# Patient Record
Sex: Male | Born: 1970 | Race: White | Hispanic: No | Marital: Single | Smoking: Former smoker
Health system: Southern US, Community
[De-identification: ages and names within clinical notes are randomized; demographics above are authoritative.]

## PROBLEM LIST (undated history)

## (undated) DIAGNOSIS — J069 Acute upper respiratory infection, unspecified: Secondary | ICD-10-CM

## (undated) DIAGNOSIS — L089 Local infection of the skin and subcutaneous tissue, unspecified: Secondary | ICD-10-CM

## (undated) DIAGNOSIS — R05 Cough: Secondary | ICD-10-CM

## (undated) DIAGNOSIS — M25512 Pain in left shoulder: Secondary | ICD-10-CM

## (undated) DIAGNOSIS — M25561 Pain in right knee: Secondary | ICD-10-CM

## (undated) DIAGNOSIS — E785 Hyperlipidemia, unspecified: Secondary | ICD-10-CM

## (undated) DIAGNOSIS — N492 Inflammatory disorders of scrotum: Secondary | ICD-10-CM

## (undated) DIAGNOSIS — I252 Old myocardial infarction: Secondary | ICD-10-CM

## (undated) DIAGNOSIS — F329 Major depressive disorder, single episode, unspecified: Secondary | ICD-10-CM

## (undated) DIAGNOSIS — R079 Chest pain, unspecified: Secondary | ICD-10-CM

## (undated) DIAGNOSIS — R519 Headache, unspecified: Secondary | ICD-10-CM

## (undated) DIAGNOSIS — E669 Obesity, unspecified: Secondary | ICD-10-CM

## (undated) DIAGNOSIS — I10 Essential (primary) hypertension: Secondary | ICD-10-CM

## (undated) DIAGNOSIS — L0292 Furuncle, unspecified: Secondary | ICD-10-CM

## (undated) DIAGNOSIS — L400 Psoriasis vulgaris: Secondary | ICD-10-CM

## (undated) DIAGNOSIS — F419 Anxiety disorder, unspecified: Secondary | ICD-10-CM

## (undated) DIAGNOSIS — L02229 Furuncle of trunk, unspecified: Secondary | ICD-10-CM

## (undated) DIAGNOSIS — L408 Other psoriasis: Secondary | ICD-10-CM

## (undated) DIAGNOSIS — R7303 Prediabetes: Secondary | ICD-10-CM

## (undated) DIAGNOSIS — M25461 Effusion, right knee: Secondary | ICD-10-CM

## (undated) DIAGNOSIS — A4902 Methicillin resistant Staphylococcus aureus infection, unspecified site: Secondary | ICD-10-CM

## (undated) DIAGNOSIS — F410 Panic disorder [episodic paroxysmal anxiety] without agoraphobia: Secondary | ICD-10-CM

## (undated) DIAGNOSIS — E119 Type 2 diabetes mellitus without complications: Secondary | ICD-10-CM

## (undated) DIAGNOSIS — R5382 Chronic fatigue, unspecified: Secondary | ICD-10-CM

## (undated) DIAGNOSIS — H538 Other visual disturbances: Secondary | ICD-10-CM

## (undated) DIAGNOSIS — A084 Viral intestinal infection, unspecified: Secondary | ICD-10-CM

## (undated) DIAGNOSIS — K219 Gastro-esophageal reflux disease without esophagitis: Secondary | ICD-10-CM

## (undated) DIAGNOSIS — M79603 Pain in arm, unspecified: Secondary | ICD-10-CM

## (undated) DIAGNOSIS — I712 Thoracic aortic aneurysm, without rupture: Secondary | ICD-10-CM

## (undated) DIAGNOSIS — R059 Cough, unspecified: Secondary | ICD-10-CM

## (undated) DIAGNOSIS — R609 Edema, unspecified: Secondary | ICD-10-CM

## (undated) DIAGNOSIS — L0293 Carbuncle, unspecified: Secondary | ICD-10-CM

## (undated) DIAGNOSIS — R51 Headache: Secondary | ICD-10-CM

## (undated) HISTORY — DX: Acute upper respiratory infection, unspecified: J06.9

## (undated) HISTORY — DX: Carbuncle, unspecified: L02.93

## (undated) HISTORY — DX: Panic disorder (episodic paroxysmal anxiety): F41.0

## (undated) HISTORY — DX: Anxiety disorder, unspecified: F41.9

## (undated) HISTORY — DX: Gastro-esophageal reflux disease without esophagitis: K21.9

## (undated) HISTORY — DX: Pain in left shoulder: M25.512

## (undated) HISTORY — DX: Prediabetes: R73.03

## (undated) HISTORY — DX: Old myocardial infarction: I25.2

## (undated) HISTORY — DX: Local infection of the skin and subcutaneous tissue, unspecified: L08.9

## (undated) HISTORY — DX: Major depressive disorder, single episode, unspecified: F32.9

## (undated) HISTORY — DX: Headache: R51

## (undated) HISTORY — DX: Effusion, right knee: M25.461

## (undated) HISTORY — DX: Inflammatory disorders of scrotum: N49.2

## (undated) HISTORY — DX: Cough: R05

## (undated) HISTORY — DX: Furuncle, unspecified: L02.92

## (undated) HISTORY — DX: Essential (primary) hypertension: I10

## (undated) HISTORY — DX: Other psoriasis: L40.8

## (undated) HISTORY — DX: Cough, unspecified: R05.9

## (undated) HISTORY — DX: Viral intestinal infection, unspecified: A08.4

## (undated) HISTORY — DX: Pain in arm, unspecified: M79.603

## (undated) HISTORY — DX: Furuncle of trunk, unspecified: L02.229

## (undated) HISTORY — DX: Edema, unspecified: R60.9

## (undated) HISTORY — DX: Chronic fatigue, unspecified: R53.82

## (undated) HISTORY — DX: Obesity, unspecified: E66.9

## (undated) HISTORY — DX: Psoriasis vulgaris: L40.0

## (undated) HISTORY — PX: FINGER SURGERY: SHX640

## (undated) HISTORY — DX: Headache, unspecified: R51.9

## (undated) HISTORY — DX: Other visual disturbances: H53.8

## (undated) HISTORY — DX: Methicillin resistant Staphylococcus aureus infection, unspecified site: A49.02

## (undated) HISTORY — DX: Chest pain, unspecified: R07.9

## (undated) HISTORY — DX: Pain in right knee: M25.561

## (undated) HISTORY — DX: Thoracic aortic aneurysm, without rupture: I71.2

## (undated) HISTORY — DX: Hyperlipidemia, unspecified: E78.5

---

## 2003-08-16 DIAGNOSIS — I252 Old myocardial infarction: Secondary | ICD-10-CM

## 2003-08-16 HISTORY — DX: Old myocardial infarction: I25.2

## 2004-07-14 ENCOUNTER — Ambulatory Visit: Payer: Self-pay | Admitting: Cardiology

## 2007-08-16 ENCOUNTER — Emergency Department (HOSPITAL_COMMUNITY): Admission: EM | Admit: 2007-08-16 | Discharge: 2007-08-16 | Payer: Self-pay | Admitting: Emergency Medicine

## 2010-10-02 ENCOUNTER — Emergency Department (HOSPITAL_COMMUNITY)
Admission: EM | Admit: 2010-10-02 | Discharge: 2010-10-02 | Disposition: A | Discharge status: Home / Self Care | Payer: Self-pay | Attending: Nurse Practitioner | Admitting: Nurse Practitioner

## 2010-10-02 ENCOUNTER — Emergency Department (HOSPITAL_COMMUNITY): Payer: Self-pay

## 2010-10-02 DIAGNOSIS — E669 Obesity, unspecified: Secondary | ICD-10-CM | POA: Insufficient documentation

## 2010-10-02 DIAGNOSIS — R05 Cough: Secondary | ICD-10-CM | POA: Insufficient documentation

## 2010-10-02 DIAGNOSIS — M545 Low back pain, unspecified: Secondary | ICD-10-CM | POA: Insufficient documentation

## 2010-10-02 DIAGNOSIS — J069 Acute upper respiratory infection, unspecified: Secondary | ICD-10-CM | POA: Insufficient documentation

## 2010-10-02 DIAGNOSIS — R059 Cough, unspecified: Secondary | ICD-10-CM | POA: Insufficient documentation

## 2010-10-02 DIAGNOSIS — J40 Bronchitis, not specified as acute or chronic: Secondary | ICD-10-CM | POA: Insufficient documentation

## 2010-10-02 DIAGNOSIS — J3489 Other specified disorders of nose and nasal sinuses: Secondary | ICD-10-CM | POA: Insufficient documentation

## 2011-05-05 LAB — RAPID STREP SCREEN (MED CTR MEBANE ONLY): Streptococcus, Group A Screen (Direct): NEGATIVE

## 2011-05-05 LAB — STREP A DNA PROBE

## 2012-06-14 ENCOUNTER — Emergency Department (HOSPITAL_COMMUNITY): Payer: Self-pay

## 2012-06-14 ENCOUNTER — Emergency Department (HOSPITAL_COMMUNITY)
Admission: EM | Admit: 2012-06-14 | Discharge: 2012-06-14 | Disposition: A | Discharge status: Home / Self Care | Payer: Self-pay | Attending: Emergency Medicine | Admitting: Emergency Medicine

## 2012-06-14 DIAGNOSIS — J029 Acute pharyngitis, unspecified: Secondary | ICD-10-CM | POA: Insufficient documentation

## 2012-06-14 DIAGNOSIS — R42 Dizziness and giddiness: Secondary | ICD-10-CM | POA: Insufficient documentation

## 2012-06-14 DIAGNOSIS — R197 Diarrhea, unspecified: Secondary | ICD-10-CM | POA: Insufficient documentation

## 2012-06-14 DIAGNOSIS — R51 Headache: Secondary | ICD-10-CM | POA: Insufficient documentation

## 2012-06-14 DIAGNOSIS — R110 Nausea: Secondary | ICD-10-CM | POA: Insufficient documentation

## 2012-06-14 LAB — CBC WITH DIFFERENTIAL/PLATELET
HCT: 46.8 % (ref 39.0–52.0)
Hemoglobin: 15.9 g/dL (ref 13.0–17.0)
Lymphocytes Relative: 42 % (ref 12–46)
Lymphs Abs: 3.6 10*3/uL (ref 0.7–4.0)
Monocytes Absolute: 0.6 10*3/uL (ref 0.1–1.0)
Monocytes Relative: 7 % (ref 3–12)
Neutro Abs: 4.1 10*3/uL (ref 1.7–7.7)
RBC: 5.43 MIL/uL (ref 4.22–5.81)
WBC: 8.5 10*3/uL (ref 4.0–10.5)

## 2012-06-14 LAB — RAPID STREP SCREEN (MED CTR MEBANE ONLY): Streptococcus, Group A Screen (Direct): NEGATIVE

## 2012-06-14 LAB — URINALYSIS, ROUTINE W REFLEX MICROSCOPIC
Hgb urine dipstick: NEGATIVE
Ketones, ur: NEGATIVE mg/dL
Leukocytes, UA: NEGATIVE
Protein, ur: NEGATIVE mg/dL
Urobilinogen, UA: 0.2 mg/dL (ref 0.0–1.0)

## 2012-06-14 LAB — COMPREHENSIVE METABOLIC PANEL
Alkaline Phosphatase: 62 U/L (ref 39–117)
BUN: 8 mg/dL (ref 6–23)
CO2: 27 mEq/L (ref 19–32)
Chloride: 101 mEq/L (ref 96–112)
Creatinine, Ser: 0.86 mg/dL (ref 0.50–1.35)
GFR calc non Af Amer: >90 mL/min (ref >90)
Potassium: 3.6 mEq/L (ref 3.5–5.1)
Total Bilirubin: 0.3 mg/dL (ref 0.3–1.2)

## 2012-06-14 MED ORDER — MECLIZINE HCL 25 MG PO TABS: 25.0000 mg | ORAL_TABLET | Freq: Four times a day (QID) | ORAL | Status: DC

## 2012-06-14 MED ORDER — MECLIZINE HCL 12.5 MG PO TABS
25.0000 mg | ORAL_TABLET | Freq: Once | ORAL | Status: AC
  Administered 2012-06-14: 25 mg via ORAL
  Filled 2012-06-14: qty 2

## 2012-06-14 NOTE — ED Provider Notes (Signed)
History     CSN: [id number]  Arrival date & time 06/14/12  1604   First MD Initiated Contact with Patient 06/14/12 1648      Chief Complaint  Patient presents with  . Dizziness  . Headache    (Consider location/radiation/quality/duration/timing/severity/associated sxs/prior treatment) HPI Comments: Patient is a 41 year old man who says he has had a pressure feeling behind his eyes, has felt chills, and he is dizzy when he changes position. He's also had some nausea and some diarrhea. He thinks he may of had fever last night. He drank tiny and whiskey last night time to get relief. He took no other type of medication. For his symptoms have persisted for approximately 36 hours he sought evaluation.  Patient is a 41 y.o. male presenting with headaches. The history is provided by the patient. No language interpreter was used.  Headache  This is a new problem. The current episode started 2 days ago. The problem occurs constantly. The problem has not changed since onset.Associated with: Dizziness, nausea. The pain is located in the frontal region. The pain is moderate. The pain does not radiate. Associated symptoms include a fever (subjective feeling of fever) and nausea. Pertinent negatives include no vomiting. Associated symptoms comments: Dizziness. He has tried nothing for the symptoms.    No past medical history on file.  No past surgical history on file.  No family history on file.  History  Substance Use Topics  . Smoking status: Not on file  . Smokeless tobacco: Not on file  . Alcohol Use: Not on file      Review of Systems  Constitutional: Positive for fever (subjective feeling of fever) and chills.       Subjective feeling of fever.  HENT: Positive for sore throat.   Eyes: Negative.   Respiratory: Negative.   Cardiovascular: Negative.   Gastrointestinal: Positive for nausea and diarrhea. Negative for vomiting.  Genitourinary: Negative.   Musculoskeletal: Negative.    Skin: Negative.   Neurological: Positive for dizziness and headaches.  Psychiatric/Behavioral: Negative.     Allergies  Penicillins and Clindamycin/lincomycin  Home Medications  No current outpatient prescriptions on file.  BP 134/81  Pulse 77  Temp 98.2 F (36.8 C) (Oral)  Resp 16  Ht 5\' 10"  (1.778 m)  Wt 230 lb (104.327 kg)  BMI 33.00 kg/m2  SpO2 100%  Physical Exam  Nursing note and vitals reviewed. Constitutional: He is oriented to person, place, and time. He appears well-developed and well-nourished. No distress.       In mild distress, complaining of headache and dizziness.  HENT:  Head: Normocephalic and atraumatic.  Right Ear: External ear normal.  Left Ear: External ear normal.  Nose: Nose normal.  Mouth/Throat: Oropharynx is clear and moist.       Mild pain over the right supraorbital sinus.  Eyes: Conjunctivae normal are normal. Pupils are equal, round, and reactive to light.       He has nystagmus on left lateral gaze.  Neck: Normal range of motion. Neck supple.  Cardiovascular: Normal rate, regular rhythm and normal heart sounds.   Pulmonary/Chest: Effort normal and breath sounds normal.  Abdominal: Soft. Bowel sounds are normal.  Musculoskeletal: Normal range of motion. He exhibits no edema and no tenderness.  Neurological: He is alert and oriented to person, place, and time.       No sensory or motor deficit. Change of position seems to cause him dizziness.  Skin: Skin is warm and dry.  Psychiatric:  He has a normal mood and affect. His behavior is normal.    ED Course  Procedures (including critical care time)  5:13 PM Pt seen --> physical exam performed. Lab workup ordered. PO Antivert ordered.  7:06 PM Results for orders placed during the hospital encounter of 06/14/12  CBC WITH DIFFERENTIAL      Component Value Range   WBC 8.5  4.0 - 10.5 K/uL   RBC 5.43  4.22 - 5.81 MIL/uL   Hemoglobin 15.9  13.0 - 17.0 g/dL   HCT 40.9  81.1 - 91.4 %    MCV 86.2  78.0 - 100.0 fL   MCH 29.3  26.0 - 34.0 pg   MCHC 34.0  30.0 - 36.0 g/dL   RDW 78.2  95.6 - 21.3 %   Platelets 185  150 - 400 K/uL   Neutrophils Relative 48  43 - 77 %   Neutro Abs 4.1  1.7 - 7.7 K/uL   Lymphocytes Relative 42  12 - 46 %   Lymphs Abs 3.6  0.7 - 4.0 K/uL   Monocytes Relative 7  3 - 12 %   Monocytes Absolute 0.6  0.1 - 1.0 K/uL   Eosinophils Relative 3  0 - 5 %   Eosinophils Absolute 0.3  0.0 - 0.7 K/uL   Basophils Relative 1  0 - 1 %   Basophils Absolute 0.1  0.0 - 0.1 K/uL  COMPREHENSIVE METABOLIC PANEL      Component Value Range   Sodium 136  135 - 145 mEq/L   Potassium 3.6  3.5 - 5.1 mEq/L   Chloride 101  96 - 112 mEq/L   CO2 27  19 - 32 mEq/L   Glucose, Bld 105 (*) 70 - 99 mg/dL   BUN 8  6 - 23 mg/dL   Creatinine, Ser 0.86  0.50 - 1.35 mg/dL   Calcium 9.4  8.4 - 57.8 mg/dL   Total Protein 7.2  6.0 - 8.3 g/dL   Albumin 3.7  3.5 - 5.2 g/dL   AST 16  0 - 37 U/L   ALT 21  0 - 53 U/L   Alkaline Phosphatase 62  39 - 117 U/L   Total Bilirubin 0.3  0.3 - 1.2 mg/dL   GFR calc non Af Amer >90  >90 mL/min   GFR calc Af Amer >90  >90 mL/min  URINALYSIS, ROUTINE W REFLEX MICROSCOPIC      Component Value Range   Color, Urine YELLOW  YELLOW   APPearance CLEAR  CLEAR   Specific Gravity, Urine 1.020  1.005 - 1.030   pH 6.5  5.0 - 8.0   Glucose, UA NEGATIVE  NEGATIVE mg/dL   Hgb urine dipstick NEGATIVE  NEGATIVE   Bilirubin Urine NEGATIVE  NEGATIVE   Ketones, ur NEGATIVE  NEGATIVE mg/dL   Protein, ur NEGATIVE  NEGATIVE mg/dL   Urobilinogen, UA 0.2  0.0 - 1.0 mg/dL   Nitrite NEGATIVE  NEGATIVE   Leukocytes, UA NEGATIVE  NEGATIVE  RAPID STREP SCREEN      Component Value Range   Streptococcus, Group A Screen (Direct) NEGATIVE  NEGATIVE   Ct Head Wo Contrast  06/14/2012  *RADIOLOGY REPORT*  Clinical Data:  History of frontal headache, nausea, and dizziness. Question low grade fever.  CT HEAD WITHOUT CONTRAST  Technique: Contiguous axial images were  obtained from the base of the skull through the vertex without contrast  Comparison:  None  Findings:  There is no evidence of brain  mass, brain hemorrhage, or acute infarction.  The ventricular system is normal size and shape.  There is no evidence of shift of midline structures, parenchymal lesion, or subdural or epidural hematoma.  The calvarium is intact.  Mastoids are well aerated.  No sinusitis is evident. There is slight nasal septal deviation to the right.  IMPRESSION: There is no evidence of brain mass, brain hemorrhage, or acute infarction.  No acute or active process is seen.  No skull lesion is evident. No sinusitis is evident.   Original Report Authenticated By: Onalee Hua Call     Lab tests all normal.  Pt feels better after taking Antivert.  Rx Antivert 25 mg qid.   1. Vertigo       Carleene Cooper III, MD 06/14/12 [phone]

## 2012-06-14 NOTE — ED Notes (Addendum)
Pt states dizziness, headache, nausea, chills. Symptoms began yesterday.diarrhea also

## 2012-06-14 NOTE — ED Notes (Signed)
Patient with no complaints at this time. Respirations even and unlabored. Skin warm/dry. Discharge instructions reviewed with patient at this time. Patient given opportunity to voice concerns/ask questions. Patient discharged at this time and left Emergency Department with steady gait.   

## 2013-06-24 ENCOUNTER — Emergency Department (HOSPITAL_COMMUNITY)
Admission: EM | Admit: 2013-06-24 | Discharge: 2013-06-25 | Disposition: A | Discharge status: Home / Self Care | Payer: PRIVATE HEALTH INSURANCE | Attending: Emergency Medicine | Admitting: Emergency Medicine

## 2013-06-24 ENCOUNTER — Emergency Department (HOSPITAL_COMMUNITY): Payer: PRIVATE HEALTH INSURANCE

## 2013-06-24 ENCOUNTER — Encounter (HOSPITAL_COMMUNITY): Payer: Self-pay | Admitting: Emergency Medicine

## 2013-06-24 DIAGNOSIS — M549 Dorsalgia, unspecified: Secondary | ICD-10-CM | POA: Insufficient documentation

## 2013-06-24 DIAGNOSIS — Z881 Allergy status to other antibiotic agents status: Secondary | ICD-10-CM | POA: Insufficient documentation

## 2013-06-24 DIAGNOSIS — R079 Chest pain, unspecified: Secondary | ICD-10-CM | POA: Insufficient documentation

## 2013-06-24 DIAGNOSIS — Z880 Allergy status to penicillin: Secondary | ICD-10-CM | POA: Insufficient documentation

## 2013-06-24 DIAGNOSIS — R0602 Shortness of breath: Secondary | ICD-10-CM | POA: Insufficient documentation

## 2013-06-24 DIAGNOSIS — IMO0001 Reserved for inherently not codable concepts without codable children: Secondary | ICD-10-CM | POA: Insufficient documentation

## 2013-06-24 DIAGNOSIS — R61 Generalized hyperhidrosis: Secondary | ICD-10-CM | POA: Insufficient documentation

## 2013-06-24 DIAGNOSIS — F17200 Nicotine dependence, unspecified, uncomplicated: Secondary | ICD-10-CM | POA: Insufficient documentation

## 2013-06-24 DIAGNOSIS — R110 Nausea: Secondary | ICD-10-CM | POA: Insufficient documentation

## 2013-06-24 LAB — CBC
HCT: 43.2 % (ref 39.0–52.0)
MCH: 29.8 pg (ref 26.0–34.0)
MCHC: 34.3 g/dL (ref 30.0–36.0)
RDW: 12.6 % (ref 11.5–15.5)

## 2013-06-24 LAB — BASIC METABOLIC PANEL
BUN: 9 mg/dL (ref 6–23)
Calcium: 9.2 mg/dL (ref 8.4–10.5)
GFR calc Af Amer: >90 mL/min (ref >90)
GFR calc non Af Amer: >90 mL/min (ref >90)
Glucose, Bld: 90 mg/dL (ref 70–99)
Potassium: 3.8 mEq/L (ref 3.5–5.1)

## 2013-06-24 LAB — POCT I-STAT TROPONIN I

## 2013-06-24 NOTE — ED Notes (Signed)
Pt. reports intermittent mid chest pain onset today with SOB and nausea.

## 2013-06-25 MED ORDER — ASPIRIN 81 MG PO CHEW
324.0000 mg | CHEWABLE_TABLET | Freq: Once | ORAL | Status: AC
  Administered 2013-06-25: 324 mg via ORAL
  Filled 2013-06-25: qty 4

## 2013-06-25 NOTE — ED Provider Notes (Signed)
CSN: [id number]     Arrival date & time 06/24/13  2245 History   First MD Initiated Contact with Patient 06/25/13 0003     Chief Complaint  Patient presents with  . Chest Pain   (Consider location/radiation/quality/duration/timing/severity/associated sxs/prior Treatment) HPI Patient's 42 year old male with a 30+ year pack history of smoking who presents with acute onset left-sided chest pain starting around 10 PM this evening. Pain did not radiate. Associate with shortness of breath and nausea. Patient was adopted and doesn't know family history. No recent travel or immobilization. He states he has chronic muscular pain including chest pain though this episode was different than chronic pain. States he was in the [facility] emergency department 8 years ago and told that he had a "minor heart attack". Patient has never seen a cardiologist. He states the pain is relieved currently. History reviewed. No pertinent past medical history. History reviewed. No pertinent past surgical history. No family history on file. History  Substance Use Topics  . Smoking status: Never Smoker   . Smokeless tobacco: Not on file  . Alcohol Use: No    Review of Systems  Constitutional: Positive for diaphoresis. Negative for fever and chills.  Respiratory: Positive for shortness of breath. Negative for cough and wheezing.   Cardiovascular: Positive for chest pain. Negative for palpitations and leg swelling.  Gastrointestinal: Positive for nausea. Negative for vomiting, abdominal pain, diarrhea and constipation.  Musculoskeletal: Positive for back pain and myalgias.  Skin: Negative for rash and wound.  Neurological: Negative for dizziness, weakness, light-headedness, numbness and headaches.  All other systems reviewed and are negative.    Allergies  Penicillins and Clindamycin/lincomycin  Home Medications  No current outpatient prescriptions on file. BP 129/85  Pulse 82  Temp(Src) 98.1 F (36.7 C)  (Oral)  Resp 14  SpO2 99% Physical Exam  Nursing note and vitals reviewed. Constitutional: He is oriented to person, place, and time. He appears well-developed and well-nourished. No distress.  HENT:  Head: Normocephalic and atraumatic.  Mouth/Throat: Oropharynx is clear and moist.  Eyes: EOM are normal. Pupils are equal, round, and reactive to light.  Neck: Normal range of motion. Neck supple.  Cardiovascular: Normal rate and regular rhythm.   Pulmonary/Chest: Effort normal and breath sounds normal. No respiratory distress. He has no wheezes. He has no rales. He exhibits tenderness (mild left lower chest tenderness to palpation. No crepitance or deformity.).  Abdominal: Soft. Bowel sounds are normal. He exhibits no distension and no mass. There is no tenderness. There is no rebound and no guarding.  Musculoskeletal: Normal range of motion. He exhibits no edema and no tenderness.  No calf swelling or tenderness.  Neurological: He is alert and oriented to person, place, and time.  Patient is alert and oriented x3 with clear, goal oriented speech. Patient has 5/5 motor in all extremities. Sensation is intact to light touch.    Skin: Skin is warm and dry. No rash noted. No erythema.  Psychiatric: He has a normal mood and affect. His behavior is normal.    ED Course  Procedures (including critical care time) Labs Review Labs Reviewed  CBC  BASIC METABOLIC PANEL  PRO B NATRIURETIC PEPTIDE  POCT I-STAT TROPONIN I   Imaging Review Dg Chest 2 View  06/25/2013   CLINICAL DATA:  Chest discomfort and shortness of breath.  EXAM: CHEST  2 VIEW  COMPARISON:  Chest radiograph performed 10/02/2010  FINDINGS: The lungs are well-aerated and clear. There is no evidence of focal opacification,  pleural effusion or pneumothorax.  The heart is normal in size; the mediastinal contour is within normal limits. No acute osseous abnormalities are seen.  IMPRESSION: No acute cardiopulmonary process seen.    Electronically Signed   By: Roanna Raider M.D.   On: 06/25/2013 00:09    EKG Interpretation     Ventricular Rate:  81 PR Interval:  124 QRS Duration: 94 QT Interval:  378 QTC Calculation: 439 R Axis:   49 Text Interpretation:  Normal sinus rhythm with sinus arrhythmia Normal ECG            MDM  We'll try to get records from [place]. Will discuss with cardiologist for possible admission.   Reviewed records from [facility]. Patient seen in emergency department on 06/28/04 for sharp chest pain, shortness of breath, and dizziness. Patient had a normal EKG and normal cardiac enzymes at the time. He also has a CT angiogram of the chest which showed no evidence of PE or aortic dissection. He declined admission but there is no evidence of any myocardial infarction or coronary artery disease.  Discussed patient with cardiology on-call. Stay the symptoms sounded atypical. Advised belt a troponin and if normal the patient remains asymptomatic can follow up with cardiology as an outpatient for further risk stratification.  Discuss results with the patient and the need to followup with cardiologist. He understands the need to return immediately for worsening symptoms or for any concerns. He is in agreement with plan.  Loren Racer, MD 06/25/13 [phone]

## 2013-08-13 ENCOUNTER — Encounter (HOSPITAL_COMMUNITY): Payer: Self-pay | Admitting: Emergency Medicine

## 2013-08-13 ENCOUNTER — Emergency Department (HOSPITAL_COMMUNITY): Payer: PRIVATE HEALTH INSURANCE

## 2013-08-13 ENCOUNTER — Emergency Department (HOSPITAL_COMMUNITY)
Admission: EM | Admit: 2013-08-13 | Discharge: 2013-08-13 | Disposition: A | Discharge status: Home / Self Care | Payer: PRIVATE HEALTH INSURANCE | Attending: Emergency Medicine | Admitting: Emergency Medicine

## 2013-08-13 DIAGNOSIS — Z880 Allergy status to penicillin: Secondary | ICD-10-CM | POA: Insufficient documentation

## 2013-08-13 DIAGNOSIS — Z87891 Personal history of nicotine dependence: Secondary | ICD-10-CM | POA: Insufficient documentation

## 2013-08-13 DIAGNOSIS — R0602 Shortness of breath: Secondary | ICD-10-CM | POA: Insufficient documentation

## 2013-08-13 DIAGNOSIS — R209 Unspecified disturbances of skin sensation: Secondary | ICD-10-CM | POA: Insufficient documentation

## 2013-08-13 DIAGNOSIS — R42 Dizziness and giddiness: Secondary | ICD-10-CM | POA: Insufficient documentation

## 2013-08-13 DIAGNOSIS — R079 Chest pain, unspecified: Secondary | ICD-10-CM

## 2013-08-13 DIAGNOSIS — R072 Precordial pain: Secondary | ICD-10-CM | POA: Insufficient documentation

## 2013-08-13 LAB — CBC WITH DIFFERENTIAL/PLATELET
Basophils Absolute: 0.1 10*3/uL (ref 0.0–0.1)
Eosinophils Relative: 2 % (ref 0–5)
HCT: 43.7 % (ref 39.0–52.0)
Hemoglobin: 14.7 g/dL (ref 13.0–17.0)
Lymphocytes Relative: 40 % (ref 12–46)
Lymphs Abs: 3.2 10*3/uL (ref 0.7–4.0)
MCV: 86.5 fL (ref 78.0–100.0)
Monocytes Absolute: 0.7 10*3/uL (ref 0.1–1.0)
Neutro Abs: 4 10*3/uL (ref 1.7–7.7)
Platelets: 189 10*3/uL (ref 150–400)
RBC: 5.05 MIL/uL (ref 4.22–5.81)
WBC: 8.2 10*3/uL (ref 4.0–10.5)

## 2013-08-13 LAB — BASIC METABOLIC PANEL
CO2: 25 mEq/L (ref 19–32)
Chloride: 100 mEq/L (ref 96–112)
GFR calc Af Amer: >90 mL/min (ref >90)
Sodium: 137 mEq/L (ref 137–147)

## 2013-08-13 LAB — POCT I-STAT TROPONIN I
Troponin i, poc: 0 ng/mL (ref 0.00–0.08)
Troponin i, poc: 0 ng/mL (ref 0.00–0.08)

## 2013-08-13 NOTE — ED Provider Notes (Signed)
CSN: [id number]     Arrival date & time 08/13/13  0221 History   First MD Initiated Contact with Patient 08/13/13 [phone]     Chief Complaint  Patient presents with  . Chest Pain    Patient is a 42 y.o. male presenting with chest pain. The history is provided by the patient.  Chest Pain Pain location:  Substernal area Pain quality: pressure   Pain radiates to:  Does not radiate Pain radiates to the back: no   Pain severity:  Mild Onset quality:  Gradual Timing:  Intermittent Progression:  Unchanged Relieved by:  Nothing Worsened by:  Nothing tried Associated symptoms: dizziness and shortness of breath   Associated symptoms: no abdominal pain, no fever, no headache, no syncope, not vomiting and no weakness   pt reports for two days he has had chest pressure that does not radiate.  It will last for up to 45 minutes and resolve on its own.  It is not provoked by exertion and will come at random CP is not pleuritic No fever/cough He reports mild SOB He also reports tonight he felt mild dizziness without HA No focal weakness He reports "tingling" in his hands/feet. No LE pain/edema No h/o DVT/PE  He reports tonight he checked his BP and it was elevated and there was a difference between his arms and he wanted to be evaluated.   PMH - none History reviewed. No pertinent past surgical history. No family history on file. History  Substance Use Topics  . Smoking status: Former Games developer  . Smokeless tobacco: Not on file  . Alcohol Use: Yes    Review of Systems  Constitutional: Negative for fever.  Respiratory: Positive for shortness of breath.   Cardiovascular: Positive for chest pain. Negative for syncope.  Gastrointestinal: Negative for vomiting, abdominal pain and diarrhea.  Neurological: Positive for dizziness. Negative for syncope, weakness and headaches.  All other systems reviewed and are negative.    Allergies  Penicillins and Clindamycin/lincomycin  Home  Medications  No current outpatient prescriptions on file. BP 148/88  Pulse 85  Temp(Src) 98.3 F (36.8 C) (Oral)  Resp 20  Ht 5\' 10"  (1.778 m)  Wt 256 lb (116.121 kg)  BMI 36.73 kg/m2  SpO2 96% Physical Exam CONSTITUTIONAL: Well developed/well nourished, no distress, well appearing HEAD: Normocephalic/atraumatic EYES: EOMI/PERRL ENMT: Mucous membranes moist NECK: supple no meningeal signs SPINE:entire spine nontender CV: S1/S2 noted, no murmurs/rubs/gallops noted LUNGS: Lungs are clear to auscultation bilaterally, no apparent distress ABDOMEN: soft, nontender, no rebound or guarding GU:no cva tenderness NEURO: Pt is awake/alert, moves all extremitiesx4 EXTREMITIES: pulses normal, full ROM, no calf tenderness or edema noted SKIN: warm, color normal PSYCH: no abnormalities of mood noted  ED Course  Procedures   3:57 AM Pt well appearing He is using phone, no distress He reports he is essentially pain free and reports he only had pressure in chest and was not exertional No pleuritic pain reported He does report [place] and told he had minor MI but no stent placed.  He reports he never followed up with cardiologist My suspicion for ACS/PE/dissection is low at this time His BP's were essentially equal in each arm when done manually Will repeat troponin/ekg at 3hr mark and if unchanged will d/c home  5:36 AM Repeat EKG/troponin unchanged Pt well appearing, talkative, using phone Stable for d/c home Labs Review Labs Reviewed  BASIC METABOLIC PANEL  CBC WITH DIFFERENTIAL  Imaging Review No results found.  EKG Interpretation    Date/Time:  Tuesday August 13 2013 02:22:11 EST Ventricular Rate:  82 PR Interval:  118 QRS Duration: 94 QT Interval:  370 QTC Calculation: 432 R Axis:   49 Text Interpretation:  Normal sinus rhythm Normal ECG When compared with ECG of 24-Jun-2013 22:48, No significant change was found Confirmed by  Bebe Shaggy  MD, Ardella Chhim [phone]) on 08/13/2013 2:44:10 AM           EKG Interpretation    Date/Time:  Tuesday August 13 2013 04:56:05 EST Ventricular Rate:  80 PR Interval:  128 QRS Duration: 94 QT Interval:  378 QTC Calculation: 435 R Axis:   43 Text Interpretation:  Normal sinus rhythm Normal ECG When compared with ECG of 13-Aug-2013 02:22, No significant change was found Confirmed by Bebe Shaggy  MD, Lavelle Berland ([id number]) on 08/13/2013 5:19:04 AM             MDM  No diagnosis found. Nursing notes including past medical history and social history reviewed and considered in documentation xrays reviewed and considered Labs/vital reviewed and considered Previous records reviewed and considered - previous ED records reviewed     Joya Gaskins, MD 08/13/13 [phone]

## 2013-08-13 NOTE — ED Notes (Signed)
Patient c/o high blood pressure tonight with dizziness and states his chest feels "tense".

## 2015-02-08 ENCOUNTER — Emergency Department (HOSPITAL_COMMUNITY)
Admission: EM | Admit: 2015-02-08 | Discharge: 2015-02-08 | Disposition: A | Discharge status: Home / Self Care | Payer: PRIVATE HEALTH INSURANCE | Attending: Emergency Medicine | Admitting: Emergency Medicine

## 2015-02-08 ENCOUNTER — Encounter (HOSPITAL_COMMUNITY): Payer: Self-pay | Admitting: Emergency Medicine

## 2015-02-08 DIAGNOSIS — L03115 Cellulitis of right lower limb: Secondary | ICD-10-CM | POA: Insufficient documentation

## 2015-02-08 DIAGNOSIS — Z87891 Personal history of nicotine dependence: Secondary | ICD-10-CM | POA: Insufficient documentation

## 2015-02-08 DIAGNOSIS — Z880 Allergy status to penicillin: Secondary | ICD-10-CM | POA: Insufficient documentation

## 2015-02-08 DIAGNOSIS — L089 Local infection of the skin and subcutaneous tissue, unspecified: Secondary | ICD-10-CM | POA: Diagnosis present

## 2015-02-08 DIAGNOSIS — L02415 Cutaneous abscess of right lower limb: Secondary | ICD-10-CM | POA: Diagnosis not present

## 2015-02-08 DIAGNOSIS — L0291 Cutaneous abscess, unspecified: Secondary | ICD-10-CM

## 2015-02-08 DIAGNOSIS — J020 Streptococcal pharyngitis: Secondary | ICD-10-CM | POA: Insufficient documentation

## 2015-02-08 DIAGNOSIS — L0390 Cellulitis, unspecified: Secondary | ICD-10-CM

## 2015-02-08 LAB — RAPID STREP SCREEN (MED CTR MEBANE ONLY): Streptococcus, Group A Screen (Direct): POSITIVE — AB

## 2015-02-08 MED ORDER — IBUPROFEN 800 MG PO TABS: 800.0000 mg | ORAL_TABLET | Freq: Three times a day (TID) | ORAL | Status: DC | PRN

## 2015-02-08 MED ORDER — DOXYCYCLINE HYCLATE 100 MG PO TABS
100.0000 mg | ORAL_TABLET | Freq: Once | ORAL | Status: AC
  Administered 2015-02-08: 100 mg via ORAL
  Filled 2015-02-08: qty 1

## 2015-02-08 MED ORDER — DOXYCYCLINE HYCLATE 100 MG PO CAPS: 100.0000 mg | ORAL_CAPSULE | Freq: Two times a day (BID) | ORAL | Status: DC

## 2015-02-08 NOTE — ED Notes (Signed)
PT c/o open area with redness and serosanguinous fluid drainage to right knee x1 week. PT also c/o sore throat with known exposure to strep throat this week but denies any fevers since a few days ago.

## 2015-02-08 NOTE — Discharge Instructions (Signed)
Abscess °An abscess is an infected area that contains a collection of pus and debris. It can occur in almost any part of the body. An abscess is also known as a furuncle or boil. °CAUSES  °An abscess occurs when tissue gets infected. This can occur from blockage of oil or sweat glands, infection of hair follicles, or a minor injury to the skin. As the body tries to fight the infection, pus collects in the area and creates pressure under the skin. This pressure causes pain. People with weakened immune systems have difficulty fighting infections and get certain abscesses more often.  °SYMPTOMS °Usually an abscess develops on the skin and becomes a painful mass that is red, warm, and tender. If the abscess forms under the skin, you may feel a moveable soft area under the skin. Some abscesses break open (rupture) on their own, but most will continue to get worse without care. The infection can spread deeper into the body and eventually into the bloodstream, causing you to feel ill.  °DIAGNOSIS  °Your caregiver will take your medical history and perform a physical exam. A sample of fluid may also be taken from the abscess to determine what is causing your infection. °TREATMENT  °Your caregiver may prescribe antibiotic medicines to fight the infection. However, taking antibiotics alone usually does not cure an abscess. Your caregiver may need to make a small cut (incision) in the abscess to drain the pus. In some cases, gauze is packed into the abscess to reduce pain and to continue draining the area. °HOME CARE INSTRUCTIONS  °· Only take over-the-counter or prescription medicines for pain, discomfort, or fever as directed by your caregiver. °· If you were prescribed antibiotics, take them as directed. Finish them even if you start to feel better. °· If gauze is used, follow your caregiver's directions for changing the gauze. °· To avoid spreading the infection: °· Keep your draining abscess covered with a  bandage. °· Wash your hands well. °· Do not share personal care items, towels, or whirlpools with others. °· Avoid skin contact with others. °· Keep your skin and clothes clean around the abscess. °· Keep all follow-up appointments as directed by your caregiver. °SEEK MEDICAL CARE IF:  °· You have increased pain, swelling, redness, fluid drainage, or bleeding. °· You have muscle aches, chills, or a general ill feeling. °· You have a fever. °MAKE SURE YOU:  °· Understand these instructions. °· Will watch your condition. °· Will get help right away if you are not doing well or get worse. °Document Released: 05/11/2005 Document Revised: 01/31/2012 Document Reviewed: 10/14/2011 °[employer]® Patient Information ©2015 [employer]. This information is not intended to replace advice given to you by your health care provider. Make sure you discuss any questions you have with your health care provider. ° °Cellulitis °Cellulitis is an infection of the skin and the tissue beneath it. The infected area is usually red and tender. Cellulitis occurs most often in the arms and lower legs.  °CAUSES  °Cellulitis is caused by bacteria that enter the skin through cracks or cuts in the skin. The most common types of bacteria that cause cellulitis are staphylococci and streptococci. °SIGNS AND SYMPTOMS  °· Redness and warmth. °· Swelling. °· Tenderness or pain. °· Fever. °DIAGNOSIS  °Your health care provider can usually determine what is wrong based on a physical exam. Blood tests may also be done. °TREATMENT  °Treatment usually involves taking an antibiotic medicine. °HOME CARE INSTRUCTIONS  °· Take your antibiotic   medicine as directed by your health care provider. Finish the antibiotic even if you start to feel better.  Keep the infected arm or leg elevated to reduce swelling.  Apply a warm cloth to the affected area up to 4 times per day to relieve pain.  Take medicines only as directed by your health care provider.  Keep all  follow-up visits as directed by your health care provider. SEEK MEDICAL CARE IF:   You notice red streaks coming from the infected area.  Your red area gets larger or turns dark in color.  Your bone or joint underneath the infected area becomes painful after the skin has healed.  Your infection returns in the same area or another area.  You notice a swollen bump in the infected area.  You develop new symptoms.  You have a fever. SEEK IMMEDIATE MEDICAL CARE IF:   You feel very sleepy.  You develop vomiting or diarrhea.  You have a general ill feeling (malaise) with muscle aches and pains. MAKE SURE YOU:   Understand these instructions.  Will watch your condition.  Will get help right away if you are not doing well or get worse. Document Released: 05/11/2005 Document Revised: 12/16/2013 Document Reviewed: 10/17/2011 [facility] Patient Information 2015 [place], [place]. This information is not intended to replace advice given to you by your health care provider. Make sure you discuss any questions you have with your health care provider.  Strep Throat Strep throat is an infection of the throat caused by a bacteria named Streptococcus pyogenes. Your health care provider may call the infection streptococcal "tonsillitis" or "pharyngitis" depending on whether there are signs of inflammation in the tonsils or back of the throat. Strep throat is most common in children aged 5-15 years during the cold months of the year, but it can occur in people of any age during any season. This infection is spread from person to person (contagious) through coughing, sneezing, or other close contact. SIGNS AND SYMPTOMS   Fever or chills.  Painful, swollen, red tonsils or throat.  Pain or difficulty when swallowing.  White or yellow spots on the tonsils or throat.  Swollen, tender lymph nodes or "glands" of the neck or under the jaw.  Red rash all over the body (rare). DIAGNOSIS  Many different  infections can cause the same symptoms. A test must be done to confirm the diagnosis so the right treatment can be given. A "rapid strep test" can help your health care provider make the diagnosis in a few minutes. If this test is not available, a light swab of the infected area can be used for a throat culture test. If a throat culture test is done, results are usually available in a day or two. TREATMENT  Strep throat is treated with antibiotic medicine. HOME CARE INSTRUCTIONS   Gargle with 1 tsp of salt in 1 cup of warm water, 3-4 times per day or as needed for comfort.  Family members who also have a sore throat or fever should be tested for strep throat and treated with antibiotics if they have the strep infection.  Make sure everyone in your household washes their hands well.  Do not share food, drinking cups, or personal items that could cause the infection to spread to others.  You may need to eat a soft food diet until your sore throat gets better.  Drink enough water and fluids to keep your urine clear or pale yellow. This will help prevent dehydration.  Get  plenty of rest.  Stay home from school, day care, or work until you have been on antibiotics for 24 hours.  Take medicines only as directed by your health care provider.  Take your antibiotic medicine as directed by your health care provider. Finish it even if you start to feel better. SEEK MEDICAL CARE IF:   The glands in your neck continue to enlarge.  You develop a rash, cough, or earache.  You cough up green, yellow-brown, or bloody sputum.  You have pain or discomfort not controlled by medicines.  Your problems seem to be getting worse rather than better.  You have a fever. SEEK IMMEDIATE MEDICAL CARE IF:   You develop any new symptoms such as vomiting, severe headache, stiff or painful neck, chest pain, shortness of breath, or trouble swallowing.  You develop severe throat pain, drooling, or changes in  your voice.  You develop swelling of the neck, or the skin on the neck becomes red and tender.  You develop signs of dehydration, such as fatigue, dry mouth, and decreased urination.  You become increasingly sleepy, or you cannot wake up completely. MAKE SURE YOU:  Understand these instructions.  Will watch your condition.  Will get help right away if you are not doing well or get worse. Document Released: 07/29/2000 Document Revised: 12/16/2013 Document Reviewed: 09/30/2010 [facility] Patient Information 2015 [place], [place]. This information is not intended to replace advice given to you by your health care provider. Make sure you discuss any questions you have with your health care provider.

## 2015-02-08 NOTE — ED Provider Notes (Signed)
TIME SEEN: 1:50 PM  CHIEF COMPLAINT: Sore throat, right knee redness/drainage  HPI: Pt is a  44 y.o. male with no significant past history who presents to the emergency department with several days of redness to the right knee. Reports that he had a small pimple to this area week ago that he popped. States that the redness was extending but he has been using warm compresses, warm showers and hydrogen peroxide with Neosporin multiple times a day and states it is improving. He is able to flex and extend his knee without difficulty. Denies any injury. He states the central area is still draining purulent drainage but this is also improving. He denies any fever.   States last night he also developed sore throat. Reports his grandson recently tested positive for strep pharyngitis. He denies any cough. No difficulty swallowing or speaking. No trismus or drooling.  ROS: See HPI Constitutional: no fever  Eyes: no drainage  ENT: no runny nose   Cardiovascular:  no chest pain  Resp: no SOB  GI: no vomiting GU: no dysuria Integumentary: no rash  Allergy: no hives  Musculoskeletal: no leg swelling  Neurological: no slurred speech ROS otherwise negative  PAST MEDICAL HISTORY/PAST SURGICAL HISTORY:  History reviewed. No pertinent past medical history.  MEDICATIONS:  Prior to Admission medications   Not on File    ALLERGIES:  Allergies  Allergen Reactions  . Penicillins Shortness Of Breath, Swelling and Rash  . Clindamycin/Lincomycin Rash    ALL MYCIN MEDICATIONS CAUSE THIS REACTION    SOCIAL HISTORY:  History  Substance Use Topics  . Smoking status: Former Smoker -- 1.00 packs/day for 25 years    Types: Cigarettes    Quit date: 06/08/2013  . Smokeless tobacco: Never Used  . Alcohol Use: 14.4 oz/week    24 Cans of beer per week    FAMILY HISTORY: History reviewed. No pertinent family history.  EXAM: BP 144/86 mmHg  Pulse 103  Temp(Src) 98.1 F (36.7 C) (Oral)  Resp 18  Ht  5\' 10"  (1.778 m)  Wt 263 lb (119.296 kg)  BMI 37.74 kg/m2  SpO2 95% CONSTITUTIONAL: Alert and oriented and responds appropriately to questions. Well-appearing; well-nourished, afebrile, nontoxic, smiling, pleasant HEAD: Normocephalic EYES: Conjunctivae clear, PERRL ENT: normal nose; no rhinorrhea; moist mucous membranes; bilateral tonsillar hypertrophy with exudate, no uvular deviation, no trismus or drooling, no muffled voice, normal phonation, no stridor, no difficulty swallowing his secretions NECK: Supple, no meningismus, no LAD  CARD: RRR; S1 and S2 appreciated; no murmurs, no clicks, no rubs, no gallops RESP: Normal chest excursion without splinting or tachypnea; breath sounds clear and equal bilaterally; no wheezes, no rhonchi, no rales, no hypoxia or respiratory distress, speaking full sentences ABD/GI: Normal bowel sounds; non-distended; soft, non-tender, no rebound, no guarding, no peritoneal signs BACK:  The back appears normal and is non-tender to palpation, there is no CVA tenderness EXT: Patient has a 4 cm circular area of erythema to the anterior right knee with a central 1 severe open area that has a small left purulent drainage, no fluctuance, no induration, full range of motion in this joint that is painless, no sign of bony injury, 2+ DP pulses bilaterally, Normal ROM in all joints; otherwise extremities are non-tender to palpation; no edema; normal capillary refill; no cyanosis, no calf tenderness or swelling    SKIN: Normal color for age and race; warm NEURO: Moves all extremities equally, sensation to light touch intact diffusely, cranial nerves II through XII intact  PSYCH: The patient's mood and manner are appropriate. Grooming and personal hygiene are appropriate.  MEDICAL DECISION MAKING: Patient here with a draining abscess and cellulitis of the right knee. No sign of septic arthritis or gout. He also has strep pharyngitis. Patient reports severe allergy to penicillin,  azithromycin as well as clindamycin. Discussed with pharmacy who recommends starting patient on doxycycline and to cover both MRSA as well as strep. Discussed with patient that doxycycline does not provide as good coverage as other antibiotics for strep but given history pharyngitis would likely be self-limited even without antibiotics I feel is appropriate medication. I do not feel he needs to be on Levaquin. Discussed return precautions and supportive care instructions. He verbalizes understanding and is comfortable with plan.    Layla Maw Ward, DO 02/08/15 1552

## 2016-06-07 ENCOUNTER — Telehealth (HOSPITAL_COMMUNITY): Payer: Self-pay | Admitting: *Deleted

## 2016-06-07 NOTE — Telephone Encounter (Signed)
phone call music playing, no answer.

## 2016-06-17 ENCOUNTER — Emergency Department (HOSPITAL_COMMUNITY)
Admission: EM | Admit: 2016-06-17 | Discharge: 2016-06-17 | Disposition: A | Discharge status: Home / Self Care | Payer: Managed Care, Other (non HMO) | Attending: Emergency Medicine | Admitting: Emergency Medicine

## 2016-06-17 ENCOUNTER — Encounter (HOSPITAL_COMMUNITY): Payer: Self-pay | Admitting: Vascular Surgery

## 2016-06-17 DIAGNOSIS — L03115 Cellulitis of right lower limb: Secondary | ICD-10-CM | POA: Diagnosis not present

## 2016-06-17 DIAGNOSIS — L02415 Cutaneous abscess of right lower limb: Secondary | ICD-10-CM | POA: Diagnosis present

## 2016-06-17 DIAGNOSIS — Z87891 Personal history of nicotine dependence: Secondary | ICD-10-CM | POA: Insufficient documentation

## 2016-06-17 MED ORDER — SULFAMETHOXAZOLE-TRIMETHOPRIM 800-160 MG PO TABS: 1.0000 | ORAL_TABLET | Freq: Two times a day (BID) | ORAL | 0 refills | Status: AC

## 2016-06-17 MED ORDER — SULFAMETHOXAZOLE-TRIMETHOPRIM 800-160 MG PO TABS
1.0000 | ORAL_TABLET | Freq: Once | ORAL | Status: AC
  Administered 2016-06-17: 1 via ORAL
  Filled 2016-06-17: qty 1

## 2016-06-17 NOTE — ED Triage Notes (Signed)
Pt reports to the ED for eval of abscess to the right hip. He reports he has hx of cysts and they always give him oral antibiotics and they will resolve. Has never had one lanced. Pt denies any fevers, chills, or N/V.

## 2016-06-17 NOTE — ED Provider Notes (Signed)
[facility]-EMERGENCY DEPT Provider Note   CSN: [id number] Arrival date & time: 06/17/16  2056  By signing my name below, I, Soijett Blue, attest that this documentation has been prepared under the direction and in the presence of Bethel BornKelly Marie Evrett Hakim, PA-C Electronically Signed: Soijett Blue, ED Scribe. 06/17/16. 10:08 PM.   History   Chief Complaint Chief Complaint  Patient presents with  . Abscess    HPI Alexander Walls is a 45 y.o. male who presents to the Emergency Department complaining of progressively worsening abscess to right hip onset 2 days. Pt states that he was able to drain a very small amount of fluid from the area. Pt reports that he has had several abscess over the past ten years. Pt notes that in the past when he has had an abscess, he would only receive abx Rx without the area being lanced. He states that he is having associated symptoms of drainage and redness. He states that he has tried warm soaks and draining the area without medications for the relief of his symptoms. He denies fever, chills, wound, and any other symptoms. Denies PMHx of DM, HIV, or other immunocompromising conditions.   The history is provided by the patient. No language interpreter was used.    History reviewed. No pertinent past medical history.  There are no active problems to display for this patient.   Past Surgical History:  Procedure Laterality Date  . FINGER SURGERY Right        Home Medications    Prior to Admission medications   Medication Sig Start Date End Date Taking? Authorizing Provider  doxycycline (VIBRAMYCIN) 100 MG capsule Take 1 capsule (100 mg total) by mouth 2 (two) times daily. 02/08/15   Kristen N Ward, DO  ibuprofen (ADVIL,MOTRIN) 800 MG tablet Take 1 tablet (800 mg total) by mouth every 8 (eight) hours as needed for mild pain. 02/08/15   Layla MawKristen N Ward, DO    Family History No family history on file.  Social History Social History  Substance Use Topics  .  Smoking status: Former Smoker    Packs/day: 1.00    Years: 25.00    Types: Cigarettes    Quit date: 06/08/2013  . Smokeless tobacco: Never Used  . Alcohol use 14.4 oz/week    24 Cans of beer per week     Allergies   Penicillins; Erythromycin; and Clindamycin/lincomycin   Review of Systems Review of Systems  Constitutional: Negative for chills and fever.  Skin: Positive for color change. Negative for wound.       Abscess to right hip without drainage     Physical Exam Updated Vital Signs BP 155/86 (BP Location: Left Arm)   Pulse 112   Temp 98.6 F (37 C) (Oral)   Resp 18   Ht 5\' 10"  (1.778 m)   Wt 277 lb 7 oz (125.8 kg)   SpO2 98%   BMI 39.81 kg/m   Physical Exam  Constitutional: He is oriented to person, place, and time. He appears well-developed and well-nourished. No distress.  HENT:  Head: Normocephalic and atraumatic.  Eyes: EOM are normal.  Neck: Neck supple.  Cardiovascular: Normal rate.   Pulmonary/Chest: Effort normal. No respiratory distress.  Abdominal: He exhibits no distension.  Musculoskeletal: Normal range of motion.  Neurological: He is alert and oriented to person, place, and time.  Skin: Skin is warm and dry. There is erythema.  15 x 10 cm of erythema and induration without any fluctuance. There is a  nodular crusted over wound which is non-draining. SEE PICTURE BELOW.  Psychiatric: He has a normal mood and affect. His behavior is normal.  Nursing note and vitals reviewed.      ED Treatments / Results  DIAGNOSTIC STUDIES: Oxygen Saturation is 98% on RA, nl by my interpretation.    COORDINATION OF CARE: 9:31 PM Discussed treatment plan with pt at bedside which includes abx Rx and pt agreed to plan.   Procedures Procedures (including critical care time)  EMERGENCY DEPARTMENT US SOFT TISSUE INTERPRETATION "Study: Limited Ultrasound of the noted body part in comments below"  INDICATIONS: Pain and Soft tissue infection Multiple views  of the body part are obtained with a multi-frequency linear probe  PERFORMED BY:  Myself  IMAGES ARCHIVED?: No  SIDE:Right   BODY PART:Lower extremity  FINDINGS: No abcess noted and Cellulitis present  LIMITATIONS:    INTERPRETATION:  No abcess noted and Cellulitis present  COMMENT:    Medications Ordered in ED Medications  sulfamethoxazole-trimethoprim (BACTRIM DS,SEPTRA DS) 800-160 MG per tablet 1 tablet (1 tablet Oral Given 06/17/16 2233)     Initial Impression / Assessment and Plan / ED Course  I have reviewed the triage vital signs and the nursing notes.  Pertinent imaging results that were available during my care of the patient were reviewed by me and considered in my medical decision making (see chart for details).  Clinical Course   45 year old male with cellulitis without evidence of abscess. Patient is afebrile, not tachycardic or tachypneic, normotensive, and not hypoxic. No abscess seen on US of soft tissue. Area marked with skin marker and given dose of Bactrim. Rx given for same. Advised return if redness is spreading beyond mark or he develops fever/chills or worsening symptoms. Patient is NAD, non-toxic, with stable VS. Patient is informed of clinical course, understands medical decision making process, and agrees with plan. Opportunity for questions provided and all questions answered. Return precautions given.   Final Clinical Impressions(s) / ED Diagnoses   Final diagnoses:  Cellulitis of right hip    New Prescriptions Discharge Medication List as of 06/17/2016 10:15 PM    START taking these medications   Details  sulfamethoxazole-trimethoprim (BACTRIM DS,SEPTRA DS) 800-160 MG tablet Take 1 tablet by mouth 2 (two) times daily., Starting Fri 06/17/2016, Until Fri 06/24/2016, Print       I personally performed the services described in this documentation, which was scribed in my presence. The recorded information has been reviewed and is  accurate.     Bethel BornKelly Marie Juda Toepfer, PA-C 06/20/16 1052    Rolland PorterMark Rosario, MD 07/12/16 [phone]

## 2016-06-17 NOTE — Discharge Instructions (Signed)
If redness is spreading beyond mark or you develop fever or chills, please come to be reevaluated Continue warm compresses

## 2016-11-04 DIAGNOSIS — M79603 Pain in arm, unspecified: Secondary | ICD-10-CM | POA: Insufficient documentation

## 2016-11-04 DIAGNOSIS — I10 Essential (primary) hypertension: Secondary | ICD-10-CM | POA: Insufficient documentation

## 2016-11-04 DIAGNOSIS — R079 Chest pain, unspecified: Secondary | ICD-10-CM | POA: Insufficient documentation

## 2016-11-04 DIAGNOSIS — M25512 Pain in left shoulder: Secondary | ICD-10-CM | POA: Insufficient documentation

## 2016-11-18 ENCOUNTER — Ambulatory Visit (INDEPENDENT_AMBULATORY_CARE_PROVIDER_SITE_OTHER): Payer: Managed Care, Other (non HMO) | Admitting: Internal Medicine

## 2016-11-18 ENCOUNTER — Ambulatory Visit: Payer: Managed Care, Other (non HMO) | Admitting: Internal Medicine

## 2016-11-18 ENCOUNTER — Encounter (INDEPENDENT_AMBULATORY_CARE_PROVIDER_SITE_OTHER): Payer: Self-pay

## 2016-11-18 ENCOUNTER — Encounter: Payer: Self-pay | Admitting: Internal Medicine

## 2016-11-18 VITALS — BP 132/90 | HR 89 | Ht 70.0 in | Wt 282.8 lb

## 2016-11-18 DIAGNOSIS — I10 Essential (primary) hypertension: Secondary | ICD-10-CM

## 2016-11-18 DIAGNOSIS — R0602 Shortness of breath: Secondary | ICD-10-CM

## 2016-11-18 DIAGNOSIS — E7800 Pure hypercholesterolemia, unspecified: Secondary | ICD-10-CM

## 2016-11-18 DIAGNOSIS — R079 Chest pain, unspecified: Secondary | ICD-10-CM

## 2016-11-18 MED ORDER — NITROGLYCERIN 0.4 MG SL SUBL: 0.4000 mg | SUBLINGUAL_TABLET | SUBLINGUAL | 6 refills | Status: DC | PRN

## 2016-11-18 MED ORDER — OMEPRAZOLE 20 MG PO CPDR: 20.0000 mg | DELAYED_RELEASE_CAPSULE | Freq: Every day | ORAL | 11 refills | Status: DC

## 2016-11-18 MED ORDER — ASPIRIN EC 81 MG PO TBEC: 81.0000 mg | DELAYED_RELEASE_TABLET | Freq: Every day | ORAL | Status: DC

## 2016-11-18 NOTE — Patient Instructions (Addendum)
Medication Instructions:  Start aspirin  daily.  Take omeprazole  daily.  Use Nitroglycerin as needed for chest pain.  Labwork: none  Testing/Procedures: Your physician has requested that you have an echocardiogram. Echocardiography is a painless test that uses sound waves to create images of your heart. It provides your doctor with information about the size and shape of your heart and how well your heart's chambers and valves are working. This procedure takes approximately one hour. There are no restrictions for this procedure.  Your physician has requested that you have an exercise tolerance test. For further information please visit [email or web address] Please also follow instruction sheet, as given.    Follow-Up: Your physician recommends that you schedule a follow-up appointment in: 1 month with Dr End.      Nitroglycerin sublingual tablets What is this medicine? NITROGLYCERIN (nye troe GLI ser in) is a type of vasodilator. It relaxes blood vessels, increasing the blood and oxygen supply to your heart. This medicine is used to relieve chest pain caused by angina. It is also used to prevent chest pain before activities like climbing stairs, going outdoors in cold weather, or sexual activity. This medicine may be used for other purposes; ask your health care provider or pharmacist if you have questions. COMMON BRAND NAME(S): Nitroquick, Nitrostat, Nitrotab What should I tell my health care provider before I take this medicine? They need to know if you have any of these conditions: -anemia -head injury, recent stroke, or bleeding in the brain -liver disease -previous heart attack -an unusual or allergic reaction to nitroglycerin, other medicines, foods, dyes, or preservatives -pregnant or trying to get pregnant -breast-feeding How should I use this medicine? Take this medicine by mouth as needed. At the first sign of an angina attack (chest pain or tightness) place  one tablet under your tongue. You can also take this medicine 5 to 10 minutes before an event likely to produce chest pain. Follow the directions on the prescription label. Let the tablet dissolve under the tongue. Do not swallow whole. Replace the dose if you accidentally swallow it. It will help if your mouth is not dry. Saliva around the tablet will help it to dissolve more quickly. Do not eat or drink, smoke or chew tobacco while a tablet is dissolving. If you are not better within 5 minutes after taking ONE dose of nitroglycerin, call 9-1-1 immediately to seek emergency medical care. Do not take more than 3 nitroglycerin tablets over 15 minutes. If you take this medicine often to relieve symptoms of angina, your doctor or health care professional may provide you with different instructions to manage your symptoms. If symptoms do not go away after following these instructions, it is important to call 9-1-1 immediately. Do not take more than 3 nitroglycerin tablets over 15 minutes. Talk to your pediatrician regarding the use of this medicine in children. Special care may be needed. Overdosage: If you think you have taken too much of this medicine contact a poison control center or emergency room at once. NOTE: This medicine is only for you. Do not share this medicine with others. What if I miss a dose? This does not apply. This medicine is only used as needed. What may interact with this medicine? Do not take this medicine with any of the following medications: -certain migraine medicines like ergotamine and dihydroergotamine (DHE) -medicines used to treat erectile dysfunction like sildenafil, tadalafil, and vardenafil -riociguat This medicine may also interact with the following medications: -alteplase -aspirin -heparin -medicines  for high blood pressure -medicines for mental depression -other medicines used to treat angina -phenothiazines like chlorpromazine, mesoridazine, prochlorperazine,  thioridazine This list may not describe all possible interactions. Give your health care provider a list of all the medicines, herbs, non-prescription drugs, or dietary supplements you use. Also tell them if you smoke, drink alcohol, or use illegal drugs. Some items may interact with your medicine. What should I watch for while using this medicine? Tell your doctor or health care professional if you feel your medicine is no longer working. Keep this medicine with you at all times. Sit or lie down when you take your medicine to prevent falling if you feel dizzy or faint after using it. Try to remain calm. This will help you to feel better faster. If you feel dizzy, take several deep breaths and lie down with your feet propped up, or bend forward with your head resting between your knees. You may get drowsy or dizzy. Do not drive, use machinery, or do anything that needs mental alertness until you know how this drug affects you. Do not stand or sit up quickly, especially if you are an older patient. This reduces the risk of dizzy or fainting spells. Alcohol can make you more drowsy and dizzy. Avoid alcoholic drinks. Do not treat yourself for coughs, colds, or pain while you are taking this medicine without asking your doctor or health care professional for advice. Some ingredients may increase your blood pressure. What side effects may I notice from receiving this medicine? Side effects that you should report to your doctor or health care professional as soon as possible: -blurred vision -dry mouth -skin rash -sweating -the feeling of extreme pressure in the head -unusually weak or tired Side effects that usually do not require medical attention (report to your doctor or health care professional if they continue or are bothersome): -flushing of the face or neck -headache -irregular heartbeat, palpitations -nausea, vomiting This list may not describe all possible side effects. Call your doctor for  medical advice about side effects. You may report side effects to FDA at 1-800-FDA-1088. Where should I keep my medicine? Keep out of the reach of children. Store at room temperature between 20 and 25 degrees C (68 and 77 degrees F). Store in Retail buyer. Protect from light and moisture. Keep tightly closed. Throw away any unused medicine after the expiration date. NOTE: This sheet is a summary. It may not cover all possible information. If you have questions about this medicine, talk to your doctor, pharmacist, or health care provider.  2018 Elsevier/Gold Standard (2013-05-30 17:57:36)      If you need a refill on your cardiac medications before your next appointment, please call your pharmacy.

## 2016-11-18 NOTE — Progress Notes (Signed)
New Outpatient Visit Date: 11/18/2016  Referring Provider: Rebecka Apley, NP [address] [place] [address]  Chief Complaint: Chest pain and shortness of breath  HPI:  Alexander Walls is seen today for the evaluation of chest pain and shortness of breath at the request of Alexander Walls. The patient is a 46 y.o. year-old male with history of possible heart attack in 2005, hypertension, dyslipidemia, prediabetes, depression, anxiety, obesity, and psoriasis. Alexander Walls reports several years of chest pain that he has attributed to GERD. It most often occurs when he is lying flat and resolves with burping. He has tried an over-the-counter and acid (possibly ranitidine) without significant improvement. More recently, however, he has experienced some exertional chest tightness without radiation as well as shortness of breath. The pain has a maximal intensity of 5/10 and resolves with rest after 2-3 minutes. He notes that it is similar to what he experienced in 2005, when he was told he had a heart attack. At that time, he experienced severe pressure/heaviness on his chest with accompanying diaphoresis, nausea, and pallor. The pain and accompanying symptoms resolved promptly with a single sublingual nitroglycerin tablet. No further workup was done at the time, and the patient was advised to follow-up with a cardiologist. He did not do this due to lack of insurance.  Alexander Walls denies undergoing prior cardiovascular testing. 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 He reports being active at work but does not exercise. He has gained about 60 pounds since he stopped smoking 3-1/2 years ago. He denies palpitations but notes fleeting lightheadedness that has occurred off and on for several years. He has dependent edema when he sleeps in a recliner with his legs on the floor. He has never been checked for sleep apnea. He denies  orthopnea and PND.  --------------------------------------------------------------------------------------------------  Cardiovascular History & Procedures: Cardiovascular Problems:  Chest pain with questionable history of heart attack in 2005  Shortness of breath  Risk Factors:  Possible CAD, hypertension, hyperlipidemia, prediabetes, obesity, and sedentary lifestyle  Cath/PCI:  None  CV Surgery:  None  EP Procedures and Devices:  None  Non-Invasive Evaluation(s):  None  Recent CV Pertinent Labs: See details below.  --------------------------------------------------------------------------------------------------  Past Medical History:  Diagnosis Date  . Annular psoriasis   . Anxiety   . Arm pain   . Blurry vision   . Boil of scrotum   . Boil of trunk   . Chest pain   . Chronic fatigue   . Cough   . Dyslipidemia   . Edema   . Effusion of right knee   . GERD without esophagitis   . Hypertension   . Hypertension   . Major depression   . Major depression   . 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 MRSA infection   . Obesity   . Pain of scalp   . Panic disorder   . Plaque psoriasis   . Prediabetes   . Recurrent boils   . Right knee pain   . Shoulder pain, left   . Skin infection   . Viral gastroenteritis   . Viral URI     Past Surgical History:  Procedure Laterality Date  . FINGER SURGERY Right     Outpatient Encounter Prescriptions as of 11/18/2016  Medication Sig  . [DISCONTINUED] doxycycline (VIBRAMYCIN) 100 MG capsule Take 1 capsule (100 mg total) by mouth 2 (two) times daily.  . [DISCONTINUED] ibuprofen (ADVIL,MOTRIN) 800 MG  tablet Take 1 tablet (800 mg total) by mouth every 8 (eight) hours as needed for mild pain.   No facility-administered encounter medications on file as of 11/18/2016.     Allergies: Penicillins; Erythromycin; and Clindamycin/lincomycin  Social History   Social History  . Marital status: Single    Spouse name: N/A  . Number of  children: N/A  . Years of education: N/A   Occupational History  . Not on file.   Social History Main Topics  . Smoking status: Former Smoker    Packs/day: 1.00    Years: 25.00    Types: Cigarettes    Quit date: 06/08/2013  . Smokeless tobacco: Never Used  . Alcohol use 21.6 oz/week    36 Cans of beer per week  . Drug use: No  . Sexual activity: Not on file   Other Topics Concern  . Not on file   Social History Narrative  . No narrative on file    Family History  Problem Relation Age of Onset  . Adopted: Yes  . Heart disease Sister   . Cancer Sister     Review of Systems: Review of Systems  Constitutional: Positive for malaise/fatigue.  HENT: Negative.   Eyes: Negative.   Respiratory: Positive for shortness of breath.        Snoring.  Cardiovascular: Positive for chest pain, orthopnea, leg swelling and PND. Negative for claudication.  Gastrointestinal: Negative.   Genitourinary: Negative.   Musculoskeletal: Positive for joint pain and myalgias.  Skin: Negative.   Neurological: Negative.   Endo/Heme/Allergies: Negative.   Psychiatric/Behavioral: Positive for depression. The patient is nervous/anxious.    --------------------------------------------------------------------------------------------------  Physical Exam: BP 132/90 (BP Location: Left Arm, Patient Position: Sitting, Cuff Size: Large)   Pulse 89   Ht  (1.778 m)   Wt 282 lb 12.8 oz (128.3 kg)   BMI 40.58 kg/m   General:  Obese man, seated comfortably in the exam room. He is accompanied by his fianc and son. HEENT: No conjunctival pallor or scleral icterus.  Moist mucous membranes.  OP clear. Neck: Thick neck without obvious lymphadenopathy or thyromegaly. No gross JVD, though evaluation is limited by body habitus. No carotid bruit. Lungs: Normal work of breathing.  Clear to auscultation bilaterally without wheezes or crackles. Heart: Regular rate and rhythm without murmurs, rubs, or  gallops.  Unable to assess PMI due to body habitus. Abd: Bowel sounds present.  Soft, NT/ND. Unable to assess hepatosplenomegaly due to body habitus. Umbilical hernia noted. Ext: No lower extremity edema.  Radial, PT, and DP pulses are 2+ bilaterally Skin: warm and dry without rash Neuro: CNIII-XII intact.  Strength and fine-touch sensation intact in upper and lower extremities bilaterally. Psych: Normal mood and affect.  EKG:  Normal sinus rhythm without significant abnormalities. R-wave progression has improved from prior outside tracing on 08/18/16, most likely reflecting lead placement (I have personally reviewed both tracings).  Outside labs: CBC (11/19/15): WBC 8.7, hemoglobin 15.6, hematocrit 46.0, platelet count 221  CMP (11/19/15): Sodium 140, potassium 4.6, chloride 103, CO2 22, BUN 8, creatinine 1.06, glucose 108, calcium 9.5, AST 13, ALT 26, alkaline phosphatase 68, total bilirubin 0.4, total protein 7.5, albumin 4.5  Hemoglobin A1c (11/19/15): 5.9  Lipid panel (11/19/15): Total cholesterol 194, triglyceride 112, HDL 42, LDL 130  TSH (11/19/15): 3.950  --------------------------------------------------------------------------------------------------  ASSESSMENT AND PLAN: Chest pain and shortness of breath Symptoms have both typical and atypical components. Given that his dyspnea and chest pain are now also somewhat  exertional, I feel that ischemia evaluation is warranted. We discussed further testing options and have agreed to obtain an exercise tolerance test as well as resting transthoracic echocardiogram, given normal baseline EKG. I have advised the patient to take aspirin 81 mg daily. He has also been given a prescription for sublingual nitroglycerin to be taken as needed for chest pain. Given likely GERD component to his chest pain, I have also recommended he take omeprazole 20 mg daily. He was advised to seek immediate medical attention if his chest pain worsens. We will discuss  further medical therapy and testing based on the results of the aforementioned tests.  Hyperlipidemia Lipid panel last year notable for LDL of 130. Based on results of stress test, we will need to consider statin therapy. Lifestyle modifications were discussed.  Hypertension Blood pressure borderline elevated today. We will not make any medication changes today, pending results of ETT and TTE. Given body habitus and reported snoring, evaluation for sleep apnea should be considered.  Follow-up: Return to clinic in 1 month.  Yvonne Kendall, MD 11/19/2016 11:56 AM

## 2016-11-19 ENCOUNTER — Encounter: Payer: Self-pay | Admitting: Internal Medicine

## 2016-11-24 ENCOUNTER — Ambulatory Visit (INDEPENDENT_AMBULATORY_CARE_PROVIDER_SITE_OTHER): Payer: Managed Care, Other (non HMO)

## 2016-11-24 ENCOUNTER — Telehealth: Payer: Self-pay | Admitting: *Deleted

## 2016-11-24 DIAGNOSIS — R0602 Shortness of breath: Secondary | ICD-10-CM

## 2016-11-24 DIAGNOSIS — R079 Chest pain, unspecified: Secondary | ICD-10-CM

## 2016-11-24 LAB — EXERCISE TOLERANCE TEST
CSEPHR: 89 %
Estimated workload: 10.1 METS
Exercise duration (min): 8 min
Exercise duration (sec): 0 s
MPHR: 176 {beats}/min
Peak HR: 157 {beats}/min
RPE: 16
Rest HR: 82 {beats}/min

## 2016-11-24 MED ORDER — CARVEDILOL 3.125 MG PO TABS: 3.1250 mg | ORAL_TABLET | Freq: Two times a day (BID) | ORAL | 6 refills | Status: AC

## 2016-11-24 NOTE — Telephone Encounter (Signed)
Copied from treadmill results 11/24/16  Notes recorded by Yvonne Kendall, MD on 11/24/2016 at 4:24 PM EDT Please let Alexander Walls know that his stress test does not show any evidence of a blockage. His blood pressure did increase quite a bit during the stress. I recommend that we start carvedilol 3.125 mg BID to see if this helps with his chest pain and shortness of breath, as well as his blood pressure. We will touch base again after completion of the echocardiogram. Thanks.

## 2016-12-05 ENCOUNTER — Ambulatory Visit (HOSPITAL_COMMUNITY): Payer: Managed Care, Other (non HMO) | Attending: Cardiology

## 2016-12-05 ENCOUNTER — Other Ambulatory Visit: Payer: Self-pay

## 2016-12-05 DIAGNOSIS — R079 Chest pain, unspecified: Secondary | ICD-10-CM | POA: Insufficient documentation

## 2016-12-05 DIAGNOSIS — R0602 Shortness of breath: Secondary | ICD-10-CM | POA: Insufficient documentation

## 2016-12-12 ENCOUNTER — Telehealth: Payer: Self-pay | Admitting: Internal Medicine

## 2016-12-12 NOTE — Telephone Encounter (Signed)
Close encounter 

## 2016-12-13 DIAGNOSIS — I712 Thoracic aortic aneurysm, without rupture, unspecified: Secondary | ICD-10-CM

## 2016-12-13 HISTORY — DX: Thoracic aortic aneurysm, without rupture, unspecified: I71.20

## 2016-12-13 HISTORY — DX: Thoracic aortic aneurysm, without rupture: I71.2

## 2016-12-30 ENCOUNTER — Ambulatory Visit (INDEPENDENT_AMBULATORY_CARE_PROVIDER_SITE_OTHER): Payer: Managed Care, Other (non HMO) | Admitting: Internal Medicine

## 2016-12-30 ENCOUNTER — Encounter (INDEPENDENT_AMBULATORY_CARE_PROVIDER_SITE_OTHER): Payer: Self-pay

## 2016-12-30 ENCOUNTER — Encounter: Payer: Self-pay | Admitting: *Deleted

## 2016-12-30 ENCOUNTER — Encounter: Payer: Self-pay | Admitting: Internal Medicine

## 2016-12-30 VITALS — BP 138/86 | HR 88 | Ht 70.0 in | Wt 279.8 lb

## 2016-12-30 DIAGNOSIS — I712 Thoracic aortic aneurysm, without rupture, unspecified: Secondary | ICD-10-CM

## 2016-12-30 DIAGNOSIS — I10 Essential (primary) hypertension: Secondary | ICD-10-CM | POA: Diagnosis not present

## 2016-12-30 DIAGNOSIS — R0609 Other forms of dyspnea: Secondary | ICD-10-CM | POA: Diagnosis not present

## 2016-12-30 DIAGNOSIS — R079 Chest pain, unspecified: Secondary | ICD-10-CM

## 2016-12-30 NOTE — Progress Notes (Signed)
Follow-up Outpatient Visit Date: 12/30/2016  Primary Care Provider: Bridget Hartshorn, NP [address] [place] [address]  Chief Complaint: Follow-up shortness of breath and chest pain  HPI:  Mr. Alexander Walls is a 46 y.o. year-old male with history of possible heart attack in 2005, hypertension, dyslipidemia, prediabetes, depression, anxiety, obesity, and psoriasis, who presents for follow-up of shortness of breath and chest pain. I met him about a month ago, which time he complained of shortness of breath and exertional chest tightness. We obtained a transthoracic echocardiogram and exercise tolerance test (see details below). There is no evidence of ischemia on stress test. Echo was notable for mild LVH and mildly dilated aortic root. We agreed to start carvedilol 3.125 mg twice a day, as a hypertensive blood pressure response was noted on the ETT.  Today, [name] repeats that he feels relatively well. He continues to have occasional "twinges" of chest pain lasting a few minutes that resolve with belching. He has not had any further exertional chest pressure or shortness of breath. He wrestled once since our prior visit, which she did without difficulty. He notes some dyspnea during his stress test, though not out of proportion to his level of activity. He denies palpitations, lightheadedness, orthopnea, PND, and edema. Though he began taking carvedilol following the aforementioned tests, he does not take this regularly twice a day. He averages taking this once a day about 4-5 days a week. He has not noted any adverse effects from carvedilol.  --------------------------------------------------------------------------------------------------  Cardiovascular History & Procedures: Cardiovascular Problems:  Questionable heart attack in 2005  Dyspnea on exertion  Risk Factors:  Possible CAD, hypertension, hyperlipidemia, prediabetes, obesity, and sedentary  lifestyle  Cath/PCI:  None  CV Surgery:  None  EP Procedures and Devices:  None  Non-Invasive Evaluation(s):  Transthoracic echocardiogram (12/05/16): Normal LV size with mild LVH. LVEF 55-60% with normal diastolic function. Mildly dilated aortic root, measuring 4.1 cm. Normal RV size and function. No significant valvular abnormalities.  Exercise tolerance test (11/24/16): Low risk study without EKG evidence of ischemia. Patient exercised 8 minutes without angina. Hypertensive blood pressure response noted.  Recent CV Pertinent Labs: Lab Results  Component Value Date   K 3.8 08/13/2013   BUN 12 08/13/2013   CREATININE 1.08 08/13/2013   Past medical and surgical history were reviewed and updated in EPIC.  Outpatient Encounter Prescriptions as of 12/30/2016  Medication Sig  . aspirin EC 81 MG tablet Take 1 tablet (81 mg total) by mouth daily.  . calcipotriene-betamethasone (TACLONEX) ointment Apply 1 application topically as directed.  . carvedilol (COREG) 3.125 MG tablet Take 1 tablet (3.125 mg total) by mouth 2 (two) times daily.  Marland Kitchen ketoconazole (NIZORAL) 2 % cream Apply 1 application topically as directed.  . nitroGLYCERIN (NITROSTAT) 0.4 MG SL tablet Place 1 tablet (0.4 mg total) under the tongue every 5 (five) minutes as needed for chest pain.  Marland Kitchen omeprazole (PRILOSEC) 20 MG capsule Take 1 capsule (20 mg total) by mouth daily.   No facility-administered encounter medications on file as of 12/30/2016.     Allergies: Penicillins; Erythromycin; and Clindamycin/lincomycin  Social History   Social History  . Marital status: Single    Spouse name: N/A  . Number of children: N/A  . Years of education: N/A   Occupational History  . Not on file.   Social History Main Topics  . Smoking status: Former Smoker    Packs/day: 1.00    Years: 25.00    Types:  Cigarettes    Quit date: 06/08/2013  . Smokeless tobacco: Never Used  . Alcohol use 21.6 oz/week    36 Cans of beer  per week  . Drug use: No  . Sexual activity: Not on file   Other Topics Concern  . Not on file   Social History Narrative  . No narrative on file    Family History  Problem Relation Age of Onset  . Adopted: Yes  . Heart disease Sister   . Cancer Sister     Review of Systems: A 12-system review of systems was performed and was negative except as noted in the HPI.  --------------------------------------------------------------------------------------------------  Physical Exam: BP 138/86   Pulse 88   Ht '5\' 10"'  (1.778 m)   Wt 279 lb 12.8 oz (126.9 kg)   BMI 40.15 kg/m   General:  Obese man, seated comfortably in the exam room. He is accompanied by his wife and son. HEENT: No conjunctival pallor or scleral icterus.  Moist mucous membranes.  OP clear. Neck: Supple without lymphadenopathy, thyromegaly, JVD or HJR, the body habitus limits evaluation. Lungs: Normal work of breathing.  Clear to auscultation bilaterally without wheezes or crackles. Heart: Distant heart sounds. Regular rate and rhythm without murmurs, rubs, or gallops. Unable to assess PMI due to body habitus. Abd: Bowel sounds present.  Soft, NT/ND . Unable to assess HSM due to body habitus. Ext: Trace pretibial edema bilaterally.  Radial, PT, and DP pulses are 2+ bilaterally. Skin: Warm and dry without rash.  Lab Results  Component Value Date   WBC 8.2 08/13/2013   HGB 14.7 08/13/2013   HCT 43.7 08/13/2013   MCV 86.5 08/13/2013   PLT 189 08/13/2013    Lab Results  Component Value Date   NA 137 08/13/2013   K 3.8 08/13/2013   CL 100 08/13/2013   CO2 25 08/13/2013   BUN 12 08/13/2013   CREATININE 1.08 08/13/2013   GLUCOSE 109 (H) 08/13/2013   ALT 21 06/14/2012    No results found for: CHOL, HDL, LDLCALC, LDLDIRECT, TRIG, CHOLHDL   Outside labs: CBC (11/19/15): WBC 8.7, hemoglobin 15.6, hematocrit 46.0, platelet count 221  CMP (11/19/15): Sodium 140, potassium 4.6, chloride 103, CO2 22, BUN 8,  creatinine 1.06, glucose 108, calcium 9.5, AST 13, ALT 26, alkaline phosphatase 68, total bilirubin 0.4, total protein 7.5, albumin 4.5  Hemoglobin A1c (11/19/15): 5.9  Lipid panel (11/19/15): Total cholesterol 194, triglyceride 112, HDL 42, LDL 130  TSH (11/19/15): 3.950  --------------------------------------------------------------------------------------------------  ASSESSMENT AND PLAN: Chest pain and shortness of breath Symptoms have improved since our last visit. Exercise tolerance test was reassuring without evidence of ischemia though hypertensive blood pressure response was noted. We will continue with medical therapy, including PPI for potential component of GERD.  Thoracic aortic aneurysm Mild dilation of the aortic root was noted by recent echocardiogram. I have recommended better characterization with cross-sectional imaging. We will therefore proceed with MRI of the chest without contrast. We discussed the importance of blood pressure control and risk factor modification to present progression of his TAA.  Hypertension Blood pressure is borderline elevated today. I have encouraged Mr. Imel to be compliant with his medications. We discussed switching to an alternative agent with once a day dosing to improve compliance, though he wishes to stick with carvedilol.  Follow-up: Return to clinic in 3 months.  Nelva Bush, MD 12/31/2016 2:02 PM

## 2016-12-30 NOTE — Patient Instructions (Addendum)
Medication Instructions:  Be sure to take your coreg (carvedilol) 3.125 mg  two times a day.  Labwork: None   Testing/Procedures: Schedule an appointment for an MRA of your chest without contrast.  Follow-Up: Your physician recommends that you schedule a follow-up appointment in: 3 months with Dr End.        If you need a refill on your cardiac medications before your next appointment, please call your pharmacy.

## 2016-12-31 ENCOUNTER — Encounter: Payer: Self-pay | Admitting: Internal Medicine

## 2016-12-31 DIAGNOSIS — I712 Thoracic aortic aneurysm, without rupture, unspecified: Secondary | ICD-10-CM | POA: Insufficient documentation

## 2017-01-11 ENCOUNTER — Ambulatory Visit (HOSPITAL_COMMUNITY)
Admission: RE | Admit: 2017-01-11 | Discharge: 2017-01-11 | Disposition: A | Discharge status: Home / Self Care | Payer: Managed Care, Other (non HMO) | Source: Ambulatory Visit | Attending: Internal Medicine | Admitting: Internal Medicine

## 2017-01-11 ENCOUNTER — Encounter (HOSPITAL_COMMUNITY): Payer: Self-pay

## 2017-01-11 DIAGNOSIS — I712 Thoracic aortic aneurysm, without rupture, unspecified: Secondary | ICD-10-CM

## 2017-01-12 ENCOUNTER — Other Ambulatory Visit: Payer: Self-pay

## 2017-01-12 DIAGNOSIS — I712 Thoracic aortic aneurysm, without rupture, unspecified: Secondary | ICD-10-CM

## 2017-01-26 ENCOUNTER — Ambulatory Visit
Admission: RE | Admit: 2017-01-26 | Discharge: 2017-01-26 | Disposition: A | Discharge status: Home / Self Care | Payer: Managed Care, Other (non HMO) | Source: Ambulatory Visit | Attending: Internal Medicine | Admitting: Internal Medicine

## 2017-01-26 DIAGNOSIS — I712 Thoracic aortic aneurysm, without rupture, unspecified: Secondary | ICD-10-CM

## 2017-01-30 ENCOUNTER — Telehealth: Payer: Self-pay | Admitting: *Deleted

## 2017-01-30 NOTE — Telephone Encounter (Signed)
Pricilla HolmFerguson, Sharon B - - MR MRA CHEST WO CONTRAST << Less Detail',event)" href="javascript:;"><< Less Detail    - MR MRA CHEST WO CONTRAST  Pricilla HolmFerguson, Sharon B  Sent: Caleen EssexFri January 27, 2017 2:52 PM  To: Jacqlyn KraussLankford, Margret Moat M, RN            Message   09-29-16 Doing f/u on referral [facility]. She stated that Mr. Duffy RhodyStanley was schedule on 01-26-17 and was unable to complete the test do to claustrophobic. Test was stop at this point and patient was told to call the office to discuss medicine to help him relax.  Once this is done, he is to call and reschedule his test.     01/30/17--I will forward to Dr End for recommendation for claustrophobia prior to MRA Chest

## 2017-01-30 NOTE — Telephone Encounter (Signed)
We can give him a prescription for diazepam 5 mg PO to be taken shortly before MRI. He can repeat the dose if he remains anxious. He will need to have someone drive him home from the study. I can sign the prescription when I am back on the office on Friday. Thanks.  Yvonne Kendallhristopher Dontario Evetts, MD [facility] Pager: [phone]

## 2017-01-31 MED ORDER — DIAZEPAM 5 MG PO TABS: ORAL_TABLET | ORAL | 0 refills | Status: DC

## 2017-01-31 NOTE — Telephone Encounter (Signed)
I spoke with patient, he is aware Dr End will give him a prescription for diazepam 5 mg PO to take shortly before MRA and he may repeat the dose if he remains anxious. Pt is willing to give this a try, is aware that he needs to have someone drive him home from the study. Pt aware I will mail a prescription for diazepam 5 mg #2 tablets to him on Friday June 22 and I will ask [facility] to contact him to reschedule MRA the end of next week or first of following week to give him enough time to get diazepam prescription prior to MRA

## 2017-02-03 NOTE — Telephone Encounter (Signed)
Prescription for diazepam mailed to pt.

## 2017-02-06 NOTE — Telephone Encounter (Signed)
In basket message to Al PimpleSharon F to contact pt to reschedule MRA

## 2017-02-16 ENCOUNTER — Other Ambulatory Visit: Payer: Self-pay

## 2017-02-16 DIAGNOSIS — I712 Thoracic aortic aneurysm, without rupture, unspecified: Secondary | ICD-10-CM

## 2017-02-20 NOTE — Telephone Encounter (Signed)
In Basket message to Al PimpleSharon F to contact pt to reschedule MRA

## 2017-02-21 NOTE — Telephone Encounter (Signed)
MRA chest scheduled for 03/10/17

## 2017-03-06 ENCOUNTER — Telehealth: Payer: Self-pay | Admitting: Internal Medicine

## 2017-03-06 NOTE — Telephone Encounter (Signed)
Follow up   Pt is calling stating he is returning call.

## 2017-03-06 NOTE — Telephone Encounter (Signed)
Pt states he does not feel he can do MRA in closed machine, even if he takes valium prior to MRA. Pt states he would do MRA if it could be done in open machine.

## 2017-03-06 NOTE — Telephone Encounter (Signed)
I rescheduled chest MRA in open machine Saturday March 18, 2017 at 2 PM--arrive 1:40 PM, [address], phone number [phone], Luanna ColeBriana.  Pt to call me back and I will give him this information.

## 2017-03-06 NOTE — Telephone Encounter (Signed)
New Message    [facility] and cancelling the MRA for Friday , he would like Dr End to call him to go over other options

## 2017-03-06 NOTE — Telephone Encounter (Signed)
Pt states he is unable to keep this appointment, I have given him contact information for Briana to reschedule appointment.

## 2017-03-08 ENCOUNTER — Other Ambulatory Visit: Payer: Managed Care, Other (non HMO)

## 2017-03-10 ENCOUNTER — Other Ambulatory Visit: Payer: Managed Care, Other (non HMO)

## 2017-03-18 ENCOUNTER — Other Ambulatory Visit: Payer: Managed Care, Other (non HMO)

## 2017-03-31 ENCOUNTER — Ambulatory Visit: Payer: Managed Care, Other (non HMO) | Admitting: Internal Medicine

## 2017-05-09 ENCOUNTER — Telehealth: Payer: Self-pay | Admitting: Internal Medicine

## 2017-05-09 NOTE — Telephone Encounter (Signed)
LMTCB

## 2017-05-09 NOTE — Telephone Encounter (Signed)
Patient was schedule x 5 for MR MRA CHEST WO CONTRAST.  He was last schedule on 03-28-17.  Per Thurston Hole note, arrangements where made  for him to have this done at [facility] in a open machine.  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 Patient had cancel the appointment and didn't reschedule.

## 2017-05-10 NOTE — Telephone Encounter (Signed)
Thank you for the update. I will discuss this with Mr. Sheard at his f/u with me on 10/5.  Yvonne Kendall, MD [facility] Pager: [phone]

## 2017-05-17 ENCOUNTER — Institutional Professional Consult (permissible substitution): Payer: Managed Care, Other (non HMO) | Admitting: Pulmonary Disease

## 2017-05-19 ENCOUNTER — Encounter: Payer: Self-pay | Admitting: Internal Medicine

## 2017-05-19 ENCOUNTER — Ambulatory Visit (INDEPENDENT_AMBULATORY_CARE_PROVIDER_SITE_OTHER): Payer: Managed Care, Other (non HMO) | Admitting: Pulmonary Disease

## 2017-05-19 ENCOUNTER — Ambulatory Visit (INDEPENDENT_AMBULATORY_CARE_PROVIDER_SITE_OTHER): Payer: Managed Care, Other (non HMO) | Admitting: Internal Medicine

## 2017-05-19 ENCOUNTER — Encounter: Payer: Self-pay | Admitting: Pulmonary Disease

## 2017-05-19 VITALS — BP 130/86 | HR 72 | Ht 70.0 in | Wt 262.6 lb

## 2017-05-19 DIAGNOSIS — I712 Thoracic aortic aneurysm, without rupture, unspecified: Secondary | ICD-10-CM

## 2017-05-19 DIAGNOSIS — I10 Essential (primary) hypertension: Secondary | ICD-10-CM | POA: Diagnosis not present

## 2017-05-19 DIAGNOSIS — G4733 Obstructive sleep apnea (adult) (pediatric): Secondary | ICD-10-CM | POA: Diagnosis not present

## 2017-05-19 DIAGNOSIS — R0602 Shortness of breath: Secondary | ICD-10-CM | POA: Insufficient documentation

## 2017-05-19 NOTE — Progress Notes (Signed)
Subjective:    Patient ID: Alexander Walls, male    DOB: 02/13/1971, 46 y.o.   MRN: [id number]  HPI Chief Complaint  Patient presents with  . Sleep Consult    Referred by Sharon Seller for daytime sleepiness, excessive snoring, per wife it appears he stops breathing at night. Has never had a SS done. Symptoms have been going on for several years.     Alexander Walls is a 46 year old obese man who presents for evaluation of sleep-disordered breathing. He is accompanied by his wife Marlana Latus who reports loud snoring for many years and witnessed apneas. Epworth sleepiness score is 9. He is often falling asleep on his recliner after coming back to work. He maintains that he has a very busy schedule with working 60 hours a week and also managing of professional wrestling league and taking care of 6 kids. Bedtime is between 11 PM and 1 AM, sleep latency is minimal, he prefers to sleep in his left side with 2 pillows, reports one to 2 nocturnal awakenings including nocturia and is out of bed by 6:30 AM with dryness of mouth but denies a headache. He has gained about 60 pounds from 220-280 over the last 5 years and has now started on a diet regimen and is slowly dropped to about his current weight of 267 pounds  There is no history suggestive of cataplexy, sleep paralysis or parasomnias   He is undergoing cardiac evaluation for finding of LV hypertrophy on echo in 11/2016 and a dilated aortic root and is being followed   Past Medical History:  Diagnosis Date  . Annular psoriasis   . Anxiety   . Arm pain   . Blurry vision   . Boil of scrotum   . Boil of trunk   . Chest pain   . Chronic fatigue   . Cough   . Dyslipidemia   . Edema   . Effusion of right knee   . GERD without esophagitis   . Hypertension   . Hypertension   . Major depression   . Major depression   . 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 MRSA infection   . Obesity   . Pain of scalp   . Panic disorder   . Plaque psoriasis   . Prediabetes   .  Recurrent boils   . Right knee pain   . Shoulder pain, left   . Skin infection   . Thoracic aortic aneurysm (HCC) 12/2016  . Viral gastroenteritis   . Viral URI     Past Surgical History:  Procedure Laterality Date  . FINGER SURGERY Right     Allergies  Allergen Reactions  . Penicillins Shortness Of Breath, Swelling and Rash  . Erythromycin Rash    Any of the mycin drugs  . Clindamycin/Lincomycin Rash    ALL MYCIN MEDICATIONS CAUSE THIS REACTION    Social History   Social History  . Marital status: Single    Spouse name: N/A  . Number of children: N/A  . Years of education: N/A   Occupational History  . Not on file.   Social History Main Topics  . Smoking status: Former Smoker    Packs/day: 1.00    Years: 25.00    Types: Cigarettes    Quit date: 06/08/2013  . Smokeless tobacco: Never Used  . Alcohol use 21.6 oz/week    36 Cans of beer per week  . Drug use: No  . Sexual activity: Not on file   Other Topics  Concern  . Not on file   Social History Narrative  . No narrative on file       Family History  Problem Relation Age of Onset  . Adopted: Yes  . Heart disease Sister   . Cancer Sister      Review of Systems   Positive for nonproductive cough, occasional sore throat, itching, anxiety depression and swelling of feet  Constitutional: negative for anorexia, fevers and sweats  Eyes: negative for irritation, redness and visual disturbance  Ears, nose, mouth, throat, and face: negative for earaches, epistaxis, nasal congestion and sore throat  Respiratory: negative for cough, dyspnea on exertion, sputum and wheezing  Cardiovascular: negative for chest pain, dyspnea,  orthopnea, palpitations and syncope  Gastrointestinal: negative for abdominal pain, constipation, diarrhea, melena, nausea and vomiting  Genitourinary:negative for dysuria, frequency and hematuria  Hematologic/lymphatic: negative for bleeding, easy bruising and lymphadenopathy    Musculoskeletal:negative for arthralgias, muscle weakness and stiff joints  Neurological: negative for coordination problems, gait problems, headaches and weakness  Endocrine: negative for diabetic symptoms including polydipsia, polyuria and weight loss     Objective:   Physical Exam  Gen. Pleasant, obese, in no distress ENT - no lesions, no post nasal drip, enlarged tonsils, class 3 airway Neck: No JVD, no thyromegaly, no carotid bruits Lungs: no use of accessory muscles, no dullness to percussion, decreased without rales or rhonchi  Cardiovascular: Rhythm regular, heart sounds  normal, no murmurs or gallops, no peripheral edema Musculoskeletal: No deformities, no cyanosis or clubbing , no tremors       Assessment & Plan:

## 2017-05-19 NOTE — Assessment & Plan Note (Signed)
Given excessive daytime somnolence, narrow pharyngeal exam, witnessed apneas & loud snoring, obstructive sleep apnea is very likely & an overnight polysomnogram will be scheduled as a home study. The pathophysiology of obstructive sleep apnea , it's cardiovascular consequences & modes of treatment including CPAP were discused with the patient in detail & they evidenced understanding.  Pretest probability is high 

## 2017-05-19 NOTE — Progress Notes (Addendum)
Follow-up Outpatient Visit Date: 05/19/2017  Primary Care Provider: Rebecka Apley, NP [address] [place] [address]  Chief Complaint: Follow-up chest pain  HPI:  Alexander Walls is a 46 y.o. year-old male with history of possible heart attack in 2005, hypertension, dyslipidemia, prediabetes, depression, anxiety, obesity, and psoriasis, who presents for follow-up of chest pain and shortness of breath. I last saw him in May, at which time he was doing well. Today, Alexander Walls has been feeling quite good. He does not have any exertional chest pain, though he still has some shortness of breath. This has improved, though, since he lost 20 pounds over the last 6 months. He has been watching his diet and avoiding sodas and carbohydrates. He is no longer taking omeprazole due to concerns about kidney health. With discontinuation of this, he has noted some mild indigestion and chest pain that he attributes to heartburn. He does not have any edema, orthopnea, or PND. He was seen in the pulmonary clinic today for evaluation of possible sleep apnea.  At our last visit, I recommended MRA of the chest to evaluate dilated thoracic aorta noted on echo. Alexander Walls was unable to tolerate the MRI scanner due to claustrophobia. He requests that the study be done in an open machine.  --------------------------------------------------------------------------------------------------  Cardiovascular History & Procedures: Cardiovascular Problems:  Questionable heart attack in 2005  Dyspnea on exertion  Risk Factors:  Possible CAD, hypertension, hyperlipidemia, prediabetes, obesity, and sedentary lifestyle  Cath/PCI:  None  CV Surgery:  None  EP Procedures and Devices:  None  Non-Invasive Evaluation(s):  Transthoracic echocardiogram (12/05/16): Normal LV size with mild LVH. LVEF 55-60% with normal diastolic function. Mildly dilated aortic root, measuring 4.1 cm. Normal RV size and  function. No significant valvular abnormalities.  Exercise tolerance test (11/24/16): Low risk study without EKG evidence of ischemia. Patient exercised 8 minutes without angina. Hypertensive blood pressure response noted.  Recent CV Pertinent Labs: Lab Results  Component Value Date   K 3.8 08/13/2013   BUN 12 08/13/2013   CREATININE 1.08 08/13/2013   Past medical and surgical history were reviewed and updated in EPIC.  Current Meds  Medication Sig  . calcipotriene-betamethasone (TACLONEX) ointment Apply 1 application topically as directed.  . carvedilol (COREG) 3.125 MG tablet Take 1 tablet (3.125 mg total) by mouth 2 (two) times daily.  Marland Kitchen ketoconazole (NIZORAL) 2 % cream Apply 1 application topically as directed.  . nitroGLYCERIN (NITROSTAT) 0.4 MG SL tablet Place 1 tablet (0.4 mg total) under the tongue every 5 (five) minutes as needed for chest pain.    Allergies: Penicillins; Erythromycin; and Clindamycin/lincomycin  Social History   Social History  . Marital status: Single    Spouse name: N/A  . Number of children: N/A  . Years of education: N/A   Occupational History  . Not on file.   Social History Main Topics  . Smoking status: Former Smoker    Packs/day: 1.00    Years: 25.00    Types: Cigarettes    Quit date: 06/08/2013  . Smokeless tobacco: Never Used  . Alcohol use 21.6 oz/week    36 Cans of beer per week  . Drug use: No  . Sexual activity: Not on file   Other Topics Concern  . Not on file   Social History Narrative  . No narrative on file    Family History  Problem Relation Age of Onset  . Adopted: Yes  . Heart disease Sister   .  Cancer Sister     Review of Systems: A 12-system review of systems was performed and was negative except as noted in the HPI.  --------------------------------------------------------------------------------------------------  Physical Exam: BP 130/86   Pulse 72   Ht  (1.778 m)   Wt 262 lb 9.6 oz (119.1  kg)   SpO2 96%   BMI 37.68 kg/m   General:  Obese man, seated comfortably in the exam room. HEENT: No conjunctival pallor or scleral icterus. Moist mucous membranes.  OP clear. Neck: Supple without lymphadenopathy, thyromegaly, JVD, or HJR, though evaluation is limited by body habitus. Lungs: Normal work of breathing. Clear to auscultation bilaterally without wheezes or crackles. Heart: Regular rate and rhythm without murmurs, rubs, or gallops. Unable to assess PMI due to body habitus. Abd: Bowel sounds present. Soft, NT/ND. Unable to assess HSM due to body habitus. Ext: No lower extremity edema. Radial, PT, and DP pulses are 2+ bilaterally. Skin: Warm and dry without rash.  EKG: Normal sinus rhythm without abnormalities.  Lab Results  Component Value Date   WBC 8.2 08/13/2013   HGB 14.7 08/13/2013   HCT 43.7 08/13/2013   MCV 86.5 08/13/2013   PLT 189 08/13/2013    Lab Results  Component Value Date   NA 137 08/13/2013   K 3.8 08/13/2013   CL 100 08/13/2013   CO2 25 08/13/2013   BUN 12 08/13/2013   CREATININE 1.08 08/13/2013   GLUCOSE 109 (H) 08/13/2013   ALT 21 06/14/2012    Outside labs: CBC (11/19/15): WBC 8.7, hemoglobin 15.6, hematocrit 46.0, platelet count 221  CMP (11/19/15): Sodium 140, potassium 4.6, chloride 103, CO2 22, BUN 8, creatinine 1.06, glucose 108, calcium 9.5, AST 13, ALT 26, alkaline phosphatase 68, total bilirubin 0.4, total protein 7.5, albumin 4.5  Hemoglobin A1c (11/19/15): 5.9  Lipid panel (11/19/15): Total cholesterol 194, triglyceride 112, HDL 42, LDL 130  TSH (11/19/15): 3.950  --------------------------------------------------------------------------------------------------  ASSESSMENT AND PLAN: Shortness of breath This has improved with weight loss and exercise. I suspect this is multifactorial, including deconditioning, obesity, and underlying lung disease. Echo showed mild diastolic dysfunction, which could also be contributing. Exercise  tolerance test was without ischemic changes. I have encouraged Alexander Walls to continue losing weight and exercise. We will continue his current doses of carvedilol.  Thoracic aortic aneurysm MRA was recommended at our last visit, Alexander Walls was unable to proceed with this due to claustrophobia. We will attempt to arrange for the study to be done in an open scanner. If this cannot be accommodated, we will instead do a CTA of the chest.  Hypertension Blood pressure is borderline elevated today. We will continue carvedilol 3.125 mg twice a day. I have encouraged Alexander Walls to continue to exercise, lose weight, and limit his sodium intake.  Follow-up: Return to clinic in 6 months.  Yvonne Kendall, MD 05/19/2017 11:28 AM

## 2017-05-19 NOTE — Patient Instructions (Addendum)
Medication Instructions:  Your physician recommends that you continue on your current medications as directed. Please refer to the Current Medication list given to you today.  Labwork: None   Testing/Procedures: I will call you about time for the MRA of your chest.   Follow-Up: Your physician wants you to follow-up in: 6 months with Dr End. (April 2019). You will receive a reminder letter in the mail two months in advance. If you don't receive a letter, please call our office to schedule the follow-up appointment.        If you need a refill on your cardiac medications before your next appointment, please call your pharmacy.

## 2017-05-19 NOTE — Patient Instructions (Signed)
You likely have obstructive sleep apnea. Schedule him sleep study. We discussed treatment options

## 2017-05-23 ENCOUNTER — Telehealth: Payer: Self-pay | Admitting: *Deleted

## 2017-05-23 DIAGNOSIS — I712 Thoracic aortic aneurysm, without rupture, unspecified: Secondary | ICD-10-CM

## 2017-05-23 NOTE — Telephone Encounter (Signed)
Pt is claustrophobic and states he is unable to have MRA in closed machine, even if pre-medicated prior to scan. Pt was scheduled to have MRA at [facility] in their open machine. Pt cancelled appointment, said he was not going to be able to have scan in open machine at [facility] because it was not completely open. He did not want to go look at the machine to see if he felt he would be okay doing scan in their machine.  Pt asked to have MRA at [facility] in [place] because he had been told  they had a completely open machine. I called [facility], [phone], spoke with Joyce Gross. Joyce Gross states they do not have any open MRA machines, they only have closed machines.   I have left a message for pt to call me so I can discuss this with him.

## 2017-05-26 NOTE — Telephone Encounter (Signed)
Dr End has recommended a CTA of chest since pt is unable to complete MRA.

## 2017-05-26 NOTE — Telephone Encounter (Signed)
Per Dr End--pt has been scheduled for a CTA of chest /aorta with and without contrast 06/09/17--pt aware.

## 2017-06-09 ENCOUNTER — Inpatient Hospital Stay: Admission: RE | Admit: 2017-06-09 | Payer: Managed Care, Other (non HMO) | Source: Ambulatory Visit

## 2017-06-19 DIAGNOSIS — G4733 Obstructive sleep apnea (adult) (pediatric): Secondary | ICD-10-CM | POA: Diagnosis not present

## 2017-06-20 DIAGNOSIS — G4733 Obstructive sleep apnea (adult) (pediatric): Secondary | ICD-10-CM | POA: Diagnosis not present

## 2017-06-21 ENCOUNTER — Telehealth: Payer: Self-pay | Admitting: Pulmonary Disease

## 2017-06-21 NOTE — Telephone Encounter (Signed)
Per RA, HST shows severe OSA with 29 events per hour. Recommends a CPAP titration as the next step. Will place order once patient is aware and has agreed.

## 2017-06-23 ENCOUNTER — Other Ambulatory Visit: Payer: Self-pay | Admitting: *Deleted

## 2017-06-23 DIAGNOSIS — G4733 Obstructive sleep apnea (adult) (pediatric): Secondary | ICD-10-CM

## 2017-06-28 NOTE — Telephone Encounter (Signed)
Attempted to call patient but his VM was not setup. Will call back later.

## 2017-07-05 ENCOUNTER — Telehealth: Payer: Self-pay | Admitting: *Deleted

## 2017-07-05 NOTE — Telephone Encounter (Signed)
Erroneous encounter

## 2017-08-14 ENCOUNTER — Encounter: Payer: Self-pay | Admitting: Internal Medicine

## 2017-10-14 ENCOUNTER — Emergency Department (HOSPITAL_COMMUNITY)
Admission: EM | Admit: 2017-10-14 | Discharge: 2017-10-15 | Disposition: A | Discharge status: Home / Self Care | Payer: Managed Care, Other (non HMO) | Attending: Emergency Medicine | Admitting: Emergency Medicine

## 2017-10-14 ENCOUNTER — Encounter (HOSPITAL_COMMUNITY): Payer: Self-pay

## 2017-10-14 ENCOUNTER — Other Ambulatory Visit: Payer: Self-pay

## 2017-10-14 DIAGNOSIS — W260XXA Contact with knife, initial encounter: Secondary | ICD-10-CM | POA: Diagnosis not present

## 2017-10-14 DIAGNOSIS — Y999 Unspecified external cause status: Secondary | ICD-10-CM | POA: Insufficient documentation

## 2017-10-14 DIAGNOSIS — Y929 Unspecified place or not applicable: Secondary | ICD-10-CM | POA: Diagnosis not present

## 2017-10-14 DIAGNOSIS — Y9389 Activity, other specified: Secondary | ICD-10-CM | POA: Diagnosis not present

## 2017-10-14 DIAGNOSIS — Z87891 Personal history of nicotine dependence: Secondary | ICD-10-CM | POA: Insufficient documentation

## 2017-10-14 DIAGNOSIS — S61412A Laceration without foreign body of left hand, initial encounter: Secondary | ICD-10-CM | POA: Insufficient documentation

## 2017-10-14 DIAGNOSIS — I10 Essential (primary) hypertension: Secondary | ICD-10-CM | POA: Insufficient documentation

## 2017-10-14 DIAGNOSIS — Z79899 Other long term (current) drug therapy: Secondary | ICD-10-CM | POA: Insufficient documentation

## 2017-10-14 DIAGNOSIS — S6982XA Other specified injuries of left wrist, hand and finger(s), initial encounter: Secondary | ICD-10-CM | POA: Diagnosis present

## 2017-10-14 MED ORDER — LIDOCAINE-EPINEPHRINE-TETRACAINE (LET) SOLUTION
3.0000 mL | Freq: Once | NASAL | Status: AC
  Administered 2017-10-14: 3 mL via TOPICAL
  Filled 2017-10-14: qty 3

## 2017-10-14 NOTE — ED Triage Notes (Signed)
Pt has 1.5 inch laceration to right hand that came while he was cutting zip ties with his knife. CMS intact. VSS

## 2017-10-15 MED ORDER — LIDOCAINE HCL (PF) 1 % IJ SOLN
5.0000 mL | Freq: Once | INTRAMUSCULAR | Status: AC
  Administered 2017-10-15: 5 mL
  Filled 2017-10-15: qty 5

## 2017-10-15 NOTE — Discharge Instructions (Signed)
Follow up with your doctor in one week for suture removal. Return here sooner for any signs of infection.  °

## 2017-10-15 NOTE — ED Notes (Signed)
TDAP wasted due to pt. Already having medication.

## 2017-10-15 NOTE — ED Provider Notes (Signed)
[facility] EMERGENCY DEPART[facility] Provider Note   CSN: [id number] Arrival date & time: 10/14/17  2249     History   Chief Complaint Chief Complaint  Patient presents with  . Extremity Laceration    HPI Alexander Walls is a 47 y.o. male here with a laceration to the dorsum of the left hand that occurred just prior to arrival to the ED while he was cutting zip ties with his knife. Patient denies other injuries.   HPI  Past Medical History:  Diagnosis Date  . Annular psoriasis   . Anxiety   . Arm pain   . Blurry vision   . Boil of scrotum   . Boil of trunk   . Chest pain   . Chronic fatigue   . Cough   . Dyslipidemia   . Edema   . Effusion of right knee   . GERD without esophagitis   . Hypertension   . Hypertension   . Major depression   . Major depression   . MI, old 502005  . MRSA infection   . Obesity   . Pain of scalp   . Panic disorder   . Plaque psoriasis   . Prediabetes   . Recurrent boils   . Right knee pain   . Shoulder pain, left   . Skin infection   . Thoracic aortic aneurysm (HCC) 12/2016  . Viral gastroenteritis   . Viral URI     Patient Active Problem List   Diagnosis Date Noted  . OSA (obstructive sleep apnea) 05/19/2017  . Shortness of breath 05/19/2017  . Thoracic aortic aneurysm without rupture (HCC) 12/31/2016  . Shoulder pain, left   . Hypertension   . Chest pain   . Arm pain     Past Surgical History:  Procedure Laterality Date  . FINGER SURGERY Right        Home Medications    Prior to Admission medications   Medication Sig Start Date End Date Taking? Authorizing Provider  calcipotriene-betamethasone (TACLONEX) ointment Apply 1 application topically as directed. 09/21/16   [provider]  carvedilol (COREG) 3.125 MG tablet Take 1 tablet (3.125 mg total) by mouth 2 (two) times daily. 11/24/16 05/19/17  End, Cristal Deerhristopher, MD  ketoconazole (NIZORAL) 2 % cream Apply 1 application topically as directed.  09/21/16   [provider]  nitroGLYCERIN (NITROSTAT) 0.4 MG SL tablet Place 1 tablet (0.4 mg total) under the tongue every 5 (five) minutes as needed for chest pain. 11/18/16 05/19/17  End, Cristal Deerhristopher, MD    Family History Family History  Adopted: Yes  Problem Relation Age of Onset  . Heart disease Sister   . Cancer Sister     Social History Social History   Tobacco Use  . Smoking status: Former Smoker    Packs/day: 1.00    Years: 25.00    Pack years: 25.00    Types: Cigarettes    Last attempt to quit: 06/08/2013    Years since quitting: 4.3  . Smokeless tobacco: Never Used  Substance Use Topics  . Alcohol use: Yes    Alcohol/week: 21.6 oz    Types: 36 Cans of beer per week  . Drug use: No     Allergies   Penicillins; Erythromycin; Clindamycin/lincomycin; and Doxycycline   Review of Systems Review of Systems  Musculoskeletal: Positive for arthralgias.       Left hand  Skin: Positive for wound.     Physical Exam Updated Vital Signs BP Marland Kitchen(!)  142/104 (BP Location: Right Arm)   Pulse (!) 102   Temp 99 F (37.2 C) (Oral)   Resp 16   Ht 5\' 10"  (1.778 m)   Wt 127 kg (280 lb)   SpO2 99%   BMI 40.18 kg/m   Physical Exam  Constitutional: He appears well-developed and well-nourished. No distress.  Eyes: EOM are normal.  Neck: Neck supple.  Cardiovascular: Regular rhythm. Tachycardia present.  Pulmonary/Chest: Effort normal and breath sounds normal.  Musculoskeletal: Normal range of motion.       Left hand: He exhibits tenderness and laceration. He exhibits normal range of motion, normal capillary refill and no deformity. Normal sensation noted. Normal strength noted. He exhibits no thumb/finger opposition.  Neurological: He is alert.  Skin: Skin is warm and dry.  Psychiatric: His mood appears anxious.  Nursing note and vitals reviewed.    ED Treatments / Results  Labs (all labs ordered are listed, but only abnormal results are displayed) Labs  Reviewed - No data to display   Radiology No results found.  Procedures .Marland KitchenLaceration Repair Date/Time: 10/15/2017 1:04 AM Performed by: Janne Napoleon, NP Authorized by: Janne Napoleon, NP   Consent:    Consent obtained:  Verbal   Consent given by:  Patient   Risks discussed:  Pain and poor wound healing Anesthesia (see MAR for exact dosages):    Anesthesia method:  Topical application and local infiltration   Topical anesthetic:  LET   Local anesthetic:  Lidocaine 1% w/o epi Laceration details:    Location:  Hand   Hand location:  L hand, dorsum   Length (cm):  3.8 Repair type:    Repair type:  Simple Pre-procedure details:    Preparation:  Patient was prepped and draped in usual sterile fashion Exploration:    Hemostasis achieved with:  LET   Wound exploration: entire depth of wound probed and visualized     Wound extent: no tendon damage noted     Contaminated: no   Treatment:    Area cleansed with:  Betadine   Irrigation solution:  Sterile water   Irrigation method:  Syringe Skin repair:    Repair method:  Sutures   Suture size:  4-0   Suture material:  Prolene   Suture technique:  Simple interrupted   Number of sutures:  3 Approximation:    Approximation:  Close Post-procedure details:    Dressing:  Non-adherent dressing   Patient tolerance of procedure:  Tolerated well, no immediate complications   (including critical care time)  Medications Ordered in ED Medications  lidocaine (PF) (XYLOCAINE) 1 % injection 5 mL (not administered)  lidocaine-EPINEPHrine-tetracaine (LET) solution (3 mLs Topical Given 10/14/17 2358)     Initial Impression / Assessment and Plan / ED Course  I have reviewed the triage vital signs and the nursing notes. 47 y.o. male here with a laceration to the left hand stable for d/c without focal neuro deficits. Patient to f/u with PCP in one week for suture removal. Return precautions discussed.  Final Clinical Impressions(s) / ED  Diagnoses   Final diagnoses:  Laceration of left hand without foreign body, initial encounter    ED Discharge Orders    None       Kerrie [place], NP 10/15/17 0111    Margarita Grizzle, MD 10/15/17 [phone]

## 2018-01-17 ENCOUNTER — Other Ambulatory Visit: Payer: Self-pay | Admitting: Internal Medicine

## 2018-01-17 DIAGNOSIS — R079 Chest pain, unspecified: Secondary | ICD-10-CM

## 2018-01-17 DIAGNOSIS — R0602 Shortness of breath: Secondary | ICD-10-CM

## 2018-01-17 NOTE — Telephone Encounter (Signed)
Please review for refill. Thanks!  

## 2018-03-05 ENCOUNTER — Telehealth: Payer: Self-pay | Admitting: Pulmonary Disease

## 2018-03-05 DIAGNOSIS — G4733 Obstructive sleep apnea (adult) (pediatric): Secondary | ICD-10-CM

## 2018-03-05 NOTE — Telephone Encounter (Signed)
Spoke with patient. He is aware of recs. Will go ahead and placed CPAP titration order. Nothing else needed at time of call.

## 2018-03-13 ENCOUNTER — Telehealth: Payer: Self-pay | Admitting: Pulmonary Disease

## 2018-03-13 DIAGNOSIS — G4733 Obstructive sleep apnea (adult) (pediatric): Secondary | ICD-10-CM

## 2018-03-13 NOTE — Telephone Encounter (Signed)
Attempted to call patient today regarding results. I did not receive an answer at time of call. I have left a voicemail message for pt to return call. X1  

## 2018-03-14 ENCOUNTER — Ambulatory Visit: Payer: Managed Care, Other (non HMO) | Attending: Pulmonary Disease | Admitting: Pulmonary Disease

## 2018-03-14 DIAGNOSIS — G4733 Obstructive sleep apnea (adult) (pediatric): Secondary | ICD-10-CM | POA: Diagnosis not present

## 2018-03-14 NOTE — Telephone Encounter (Signed)
Tried to call pt, VM is full at time of call. X2 Will try again later

## 2018-03-15 DIAGNOSIS — G4733 Obstructive sleep apnea (adult) (pediatric): Secondary | ICD-10-CM

## 2018-03-15 NOTE — Procedures (Signed)
Patient Name: Roland RackStanley, Taron Study Date: 03/14/2018 Gender: Male D.O.B: [birth year] Age (years): [age] Referring Provider: Not Available Height (inches): 70 Interpreting Physician: Cyril Mourningakesh Alva MD, ABSM Weight (lbs): 262 RPSGT: Peak, Robert BMI: 38 MRN: [id number] Neck Size: 22.00 <br> <br> CLINICAL INFORMATION The patient is referred for a CPAP titration to treat sleep apnea.  Date of HST: 06/2017 , severe AHI 29/h  SLEEP STUDY TECHNIQUE As per the AASM Manual for the Scoring of Sleep and Associated Events v2.3 (April 2016) with a hypopnea requiring 4% desaturations.  The channels recorded and monitored were frontal, central and occipital EEG, electrooculogram (EOG), submentalis EMG (chin), nasal and oral airflow, thoracic and abdominal wall motion, anterior tibialis EMG, snore microphone, electrocardiogram, and pulse oximetry. Continuous positive airway pressure (CPAP) was initiated at the beginning of the study and titrated to treat sleep-disordered breathing.  MEDICATIONS Medications self-administered by patient taken the night of the study : N/A  RESPIRATORY PARAMETERS Optimal PAP Pressure (cm): 13 AHI at Optimal Pressure (/hr): N/A Overall Minimal O2 (%): 85.0 Supine % at Optimal Pressure (%): N/A Minimal O2 at Optimal Pressure (%): 85.0   SLEEP ARCHITECTURE The study was initiated at 10:26:09 PM and ended at 5:02:36 AM.  Sleep onset time was 36.4 minutes and the sleep efficiency was 83.9%%. The total sleep time was 332.6 minutes.  The patient spent 5.0%% of the night in stage N1 sleep, 51.7%% in stage N2 sleep, 14.1%% in stage N3 and 29.2% in REM.Stage REM latency was 56.0 minutes  Wake after sleep onset was 27.5. Alpha intrusion was absent. Supine sleep was 18.95%.  CARDIAC DATA The 2 lead EKG demonstrated sinus rhythm. The mean heart rate was 78.9 beats per minute. Other EKG findings include: None.   LEG MOVEMENT DATA The total Periodic Limb Movements of Sleep (PLMS)  were 0. The PLMS index was 0.0. A PLMS index of <15 is considered normal in adults.  IMPRESSIONS - The optimal PAP pressure was 13 cm of water. - Central sleep apnea was not noted during this titration (CAI = 1.4/h). - Moderate oxygen desaturations were observed during this titration (min O2 = 85.0%). - The patient snored with loud snoring volume during this titration study. - No cardiac abnormalities were observed during this study. - Clinically significant periodic limb movements were not noted during this study. Arousals associated with PLMs were rare.   DIAGNOSIS - Obstructive Sleep Apnea (327.23 [G47.33 ICD-10])   RECOMMENDATIONS - Trial of CPAP therapy on 13 cm H2O with a Small size Resmed Nasal Pillow Mask AirFit P10 mask and heated humidification. - Avoid alcohol, sedatives and other CNS depressants that may worsen sleep apnea and disrupt normal sleep architecture. - Sleep hygiene should be reviewed to assess factors that may improve sleep quality. - Weight management and regular exercise should be initiated or continued. - Return to Sleep Center for re-evaluation after 4 weeks of therapy   Cyril Mourningakesh Alva MD Board Certified in Sleep medicine

## 2018-03-15 NOTE — Telephone Encounter (Signed)
Attempted to call pt. I did not receive an answer. Call went straight to voicemail, which was full. Will try back.

## 2018-03-15 NOTE — Telephone Encounter (Signed)
Pl send Rx for auto CPAP  8-13 cm H2O with a Small size Resmed Nasal Pillow Mask AirFit P10 mask and heated humidification. OV in 6w ks with  NP

## 2018-03-19 NOTE — Telephone Encounter (Signed)
Called and spoke with patient regarding results.  Informed the patient of results and recommendations today. Placed order for auto CPAP  8-13 cm H2O with a Small size Resmed Nasal Pillow Mask AirFit P10 mask and heated humidification. OV in 6w ks with  NP on 05/07/18 with BW Pt verbalized understanding and denied any questions or concerns at this time.  Nothing further needed.

## 2018-05-07 ENCOUNTER — Encounter: Payer: Self-pay | Admitting: Primary Care

## 2018-05-07 ENCOUNTER — Ambulatory Visit (INDEPENDENT_AMBULATORY_CARE_PROVIDER_SITE_OTHER): Payer: Managed Care, Other (non HMO) | Admitting: Primary Care

## 2018-05-07 DIAGNOSIS — G4733 Obstructive sleep apnea (adult) (pediatric): Secondary | ICD-10-CM

## 2018-05-07 NOTE — Progress Notes (Signed)
@Patient  ID: Alexander Walls, male    DOB: Sep 18, 1970, 47 y.o.   MRN: [id number]  Chief Complaint  Patient presents with  . Follow-up    CPAP-[facility]    Referring provider: Rebecka Apley, NP  HPI: 47 year old male, former smoker quit 2014. PMH hypertension, obstructive sleep apnea. HST completed on 06/2017, HST showing severe sleep apnea with AHI 29/hr. Completed sleep titration study, optimal pressure 13cm H20, moderate oxygen desaturation were noted during study with minO2 85%. Recommended return to sleep lab for re-evaluation after 4 weeks for therapy.  Order placed for autoCPAP8-13 cm H2O with a Small size Resmed Nasal Pillow Mask AirFit P10 mask and heated humidification.  05/07/2018 Patient presents today for CPAP follow-up. He's been compliant with cpap, has been wearing for 5-6 weeks. Had 2 nights where he fell asleep and forgot to put mask on. He reports a great deal of benefit from use, feels more energized during the day and sleeping well throughout the night. Blood pressure has also improved. DME is [facility].   Airview download review: - Usage 41/45 days (91% compliance), 40 days >4hours (89% compliance) - Pressure set 8-13cm H20 (95 percentile 11.8) - AI 2.2, HI 0.3 - Central apneas 0.5, obstructive apneas 1.6 - Min air leaks  - AHI 2.5   Allergies  Allergen Reactions  . Penicillins Shortness Of Breath, Swelling and Rash  . Erythromycin Rash    Any of the mycin drugs  . Clindamycin/Lincomycin Rash    ALL MYCIN MEDICATIONS CAUSE THIS REACTION  . Doxycycline Rash    Immunization History  Administered Date(s) Administered  . Tdap 04/11/2008    Past Medical History:  Diagnosis Date  . Annular psoriasis   . Anxiety   . Arm pain   . Blurry vision   . Boil of scrotum   . Boil of trunk   . Chest pain   . Chronic fatigue   . Cough   . Dyslipidemia   . Edema   . Effusion of right knee   . GERD without esophagitis   . Hypertension   . Hypertension   .  Major depression   . Major depression   . MI, old 60  . MRSA infection   . Obesity   . Pain of scalp   . Panic disorder   . Plaque psoriasis   . Prediabetes   . Recurrent boils   . Right knee pain   . Shoulder pain, left   . Skin infection   . Thoracic aortic aneurysm (HCC) 12/2016  . Viral gastroenteritis   . Viral URI     Tobacco History: Social History   Tobacco Use  Smoking Status Former Smoker  . Packs/day: 1.00  . Years: 25.00  . Pack years: 25.00  . Types: Cigarettes  . Last attempt to quit: 06/08/2013  . Years since quitting: 4.9  Smokeless Tobacco Never Used   Counseling given: Not Answered   Outpatient Medications Prior to Visit  Medication Sig Dispense Refill  . calcipotriene-betamethasone (TACLONEX) ointment Apply 1 application topically as directed.    . carvedilol (COREG) 3.125 MG tablet Take 1 tablet (3.125 mg total) by mouth 2 (two) times daily. 60 tablet 6  . hydrochlorothiazide (HYDRODIURIL) 25 MG tablet Take 25 mg by mouth daily.  5  . ketoconazole (NIZORAL) 2 % cream Apply 1 application topically as directed.    . nitroGLYCERIN (NITROSTAT) 0.4 MG SL tablet Place 1 tablet (0.4 mg total) under the tongue every  5 (five) minutes as needed for chest pain. Please make appt with Dr. Okey DupreEnd. 1st attempt 25 tablet 3  . pantoprazole (PROTONIX) 40 MG tablet Take 40 mg by mouth every morning.  2   No facility-administered medications prior to visit.     Review of Systems  Review of Systems  Constitutional: Negative.   HENT: Negative.   Respiratory: Negative.   Cardiovascular: Negative.   Psychiatric/Behavioral: Negative.     Physical Exam  BP 124/78 (BP Location: Left Arm, Cuff Size: Normal)   Pulse 80   Ht 5\' 10"  (1.778 m)   Wt 292 lb (132.5 kg)   SpO2 96%   BMI 41.90 kg/m  Physical Exam  Constitutional: He is oriented to person, place, and time. He appears well-developed and well-nourished. No distress.  Obese male   HENT:  Head:  Normocephalic and atraumatic.  Mallampati class III Thick neck   Eyes: Pupils are equal, round, and reactive to light. EOM are normal.  Neck: Normal range of motion. Neck supple.  Cardiovascular: Normal rate, regular rhythm, normal heart sounds and intact distal pulses.  Pulmonary/Chest: Effort normal and breath sounds normal. No respiratory distress. He has no wheezes.  Abdominal: Soft. Bowel sounds are normal. There is no tenderness.  Neurological: He is alert and oriented to person, place, and time.  Skin: Skin is warm and dry. No rash noted. No erythema.  Psoriasis patches to right knee and right elbow   Psychiatric: He has a normal mood and affect. His behavior is normal. Judgment normal.     Lab Results:  CBC    Component Value Date/Time   WBC 8.2 08/13/2013 0304   RBC 5.05 08/13/2013 0304   HGB 14.7 08/13/2013 0304   HCT 43.7 08/13/2013 0304   PLT 189 08/13/2013 0304   MCV 86.5 08/13/2013 0304   MCH 29.1 08/13/2013 0304   MCHC 33.6 08/13/2013 0304   RDW 12.8 08/13/2013 0304   LYMPHSABS 3.2 08/13/2013 0304   MONOABS 0.7 08/13/2013 0304   EOSABS 0.2 08/13/2013 0304   BASOSABS 0.1 08/13/2013 0304    BMET    Component Value Date/Time   NA 137 08/13/2013 0304   K 3.8 08/13/2013 0304   CL 100 08/13/2013 0304   CO2 25 08/13/2013 0304   GLUCOSE 109 (H) 08/13/2013 0304   BUN 12 08/13/2013 0304   CREATININE 1.08 08/13/2013 0304   CALCIUM 9.2 08/13/2013 0304   GFRNONAA 84 (L) 08/13/2013 0304   GFRAA >90 08/13/2013 0304    BNP No results found for: BNP  ProBNP    Component Value Date/Time   PROBNP 113.8 06/24/2013 2252    Imaging: No results found.   Assessment & Plan:   OSA (obstructive sleep apnea) - HST showed severe sleep apnea, AHI 29  - Started on CPAP auto titrate 8-13cm H20 - Compliant and reports benefit from CPAP use - Patient is not to drive if experiences excessive daytime fatigue or somnolence  - FU in 1 year with Dr. Vassie LollAlva and as  needed  Hypertension Improved; BP 124/78     Glenford BayleyElizabeth W Maryellen Dowdle, NP 05/07/2018

## 2018-05-07 NOTE — Assessment & Plan Note (Signed)
Improved; BP 124/78

## 2018-05-07 NOTE — Patient Instructions (Addendum)
[employer] for FMLA  Return in 1 year with Dr. Vassie LollAlva or NP for CPAP follow-up    Do not drive if experiencing excessive daytime fatigue or somnolence Goal 100% compliance with CPAP, aim to wear 4-6 hours + every night

## 2018-05-07 NOTE — Assessment & Plan Note (Signed)
-   HST showed severe sleep apnea, AHI 29  - Started on CPAP auto titrate 8-13cm H20 - Compliant and reports benefit from CPAP use - Patient is not to drive if experiences excessive daytime fatigue or somnolence  - FU in 1 year with Dr. Vassie LollAlva and as needed

## 2018-05-08 ENCOUNTER — Telehealth: Payer: Self-pay | Admitting: Primary Care

## 2018-05-08 NOTE — Telephone Encounter (Signed)
Patient brought FMLA paperwork to his appt with Beth. Interoffice paperwork to [employer] to begin proces. Belinda Fisherdvised Lisa C to advise patient that [employer] will be contacting him prior to completion to have him sign release form and pay the fee.-pr

## 2018-05-21 ENCOUNTER — Telehealth: Payer: Self-pay | Admitting: Pulmonary Disease

## 2018-05-21 NOTE — Telephone Encounter (Signed)
I have left a message with [employer] to check on this paperwork.  Attempted to contact pt. I did not receive an answer. There was no option for me to leave a message due to his voicemail not being set up. Will try back.

## 2018-05-21 NOTE — Telephone Encounter (Signed)
Attempted to contact pt. I did not receive an answer. There was no option for me to leave a message due to his voicemail not being set up. Will try back.  

## 2018-05-21 NOTE — Telephone Encounter (Signed)
Pt returning call. Pt contact number [phone]/kob

## 2018-05-21 NOTE — Telephone Encounter (Signed)
Attempted to call Patient.  I did not receive an answer and VM has not been set up at this time.  Will call back at another time.

## 2018-05-21 NOTE — Telephone Encounter (Signed)
Pt returning call pt contact number [phone]/kob

## 2018-05-22 NOTE — Telephone Encounter (Signed)
Patient returned call, CB is [phone].

## 2018-05-22 NOTE — Telephone Encounter (Signed)
Called patient unable to reach unable to leave voicemail.  

## 2018-05-22 NOTE — Telephone Encounter (Signed)
Called patient unable to reach left message to give us a call back.

## 2018-05-23 NOTE — Telephone Encounter (Signed)
ATC patient at number given-no vm set up at this time. If and when patient calls back please give him number to [employer] to contact them regarding FMLA paperwork.   [phone]

## 2018-05-23 NOTE — Telephone Encounter (Signed)
Attempted to call Patient.  VM has not been set up at this time.  Unable to leave message.

## 2018-05-23 NOTE — Telephone Encounter (Signed)
Pt returned call. CB is [phone]

## 2018-05-24 NOTE — Telephone Encounter (Signed)
ATC patient, VM was not setup. Will attempt to reach patient again later.

## 2018-05-25 NOTE — Telephone Encounter (Signed)
Attempted to call pt but unable to reach him and unable to leave a message due to VM not being set up.  Will try to call pt back later.

## 2018-05-28 NOTE — Telephone Encounter (Signed)
We have attempted to contact pt several times with no success or call back from pt. Per triage protocol, message will be closed.  

## 2018-06-07 ENCOUNTER — Other Ambulatory Visit: Payer: Self-pay | Admitting: Gastroenterology

## 2018-06-07 DIAGNOSIS — R1011 Right upper quadrant pain: Secondary | ICD-10-CM

## 2018-10-29 ENCOUNTER — Emergency Department (HOSPITAL_COMMUNITY)
Admission: EM | Admit: 2018-10-29 | Discharge: 2018-10-29 | Disposition: A | Discharge status: Home / Self Care | Payer: Managed Care, Other (non HMO) | Attending: Emergency Medicine | Admitting: Emergency Medicine

## 2018-10-29 ENCOUNTER — Encounter (HOSPITAL_COMMUNITY): Payer: Self-pay | Admitting: Emergency Medicine

## 2018-10-29 ENCOUNTER — Other Ambulatory Visit: Payer: Self-pay

## 2018-10-29 DIAGNOSIS — Z79899 Other long term (current) drug therapy: Secondary | ICD-10-CM | POA: Insufficient documentation

## 2018-10-29 DIAGNOSIS — L0291 Cutaneous abscess, unspecified: Secondary | ICD-10-CM | POA: Insufficient documentation

## 2018-10-29 DIAGNOSIS — I10 Essential (primary) hypertension: Secondary | ICD-10-CM | POA: Insufficient documentation

## 2018-10-29 DIAGNOSIS — Z87891 Personal history of nicotine dependence: Secondary | ICD-10-CM | POA: Insufficient documentation

## 2018-10-29 MED ORDER — ONDANSETRON HCL 4 MG/2ML IJ SOLN
4.0000 mg | Freq: Once | INTRAMUSCULAR | Status: DC
  Filled 2018-10-29: qty 2

## 2018-10-29 MED ORDER — LEVOFLOXACIN IN D5W 500 MG/100ML IV SOLN
500.0000 mg | Freq: Once | INTRAVENOUS | Status: DC
  Filled 2018-10-29: qty 100

## 2018-10-29 MED ORDER — LEVOFLOXACIN 500 MG PO TABS: 500.0000 mg | ORAL_TABLET | Freq: Every day | ORAL | 0 refills | Status: DC

## 2018-10-29 MED ORDER — HYDROMORPHONE HCL 1 MG/ML IJ SOLN
1.0000 mg | Freq: Once | INTRAMUSCULAR | Status: DC
  Filled 2018-10-29: qty 1

## 2018-10-29 NOTE — ED Notes (Signed)
Patient verbalizes understanding of discharge instructions. Opportunity for questioning and answers were provided. Armband removed by staff, pt discharged from ED ambulatory w/ wife 

## 2018-10-29 NOTE — Discharge Instructions (Signed)
Follow-up with [facility] this week for recheck. If he get worse and they cannot see her right away ventricle to [facility] burn to Department

## 2018-10-29 NOTE — ED Provider Notes (Signed)
[facility] EMERGENCY DEPARTMENT Provider Note   CSN: [id number] Arrival date & time: 10/29/18  1726    History   Chief Complaint Chief Complaint  Patient presents with  . Abscess    HPI Alexander Walls is a 48 y.o. male.     Patient sent here for swelling to his scrotum. He has been taking Bactrim  The history is provided by the patient.  Abscess  Abscess location: scrotum. Size:  1 cm Abscess quality: draining (1 cm)   Red streaking: no   Progression:  Unchanged Chronicity:  New Context: not diabetes   Relieved by:  Nothing Worsened by:  Nothing Ineffective treatments:  None tried Associated symptoms: no fatigue and no headaches     Past Medical History:  Diagnosis Date  . Annular psoriasis   . Anxiety   . Arm pain   . Blurry vision   . Boil of scrotum   . Boil of trunk   . Chest pain   . Chronic fatigue   . Cough   . Dyslipidemia   . Edema   . Effusion of right knee   . GERD without esophagitis   . Hypertension   . Hypertension   . Major depression   . Major depression   . MI, old 18  . MRSA infection   . Obesity   . Pain of scalp   . Panic disorder   . Plaque psoriasis   . Prediabetes   . Recurrent boils   . Right knee pain   . Shoulder pain, left   . Skin infection   . Thoracic aortic aneurysm (HCC) 12/2016  . Viral gastroenteritis   . Viral URI     Patient Active Problem List   Diagnosis Date Noted  . OSA (obstructive sleep apnea) 05/19/2017  . Shortness of breath 05/19/2017  . Thoracic aortic aneurysm without rupture (HCC) 12/31/2016  . Shoulder pain, left   . Hypertension   . Chest pain   . Arm pain     Past Surgical History:  Procedure Laterality Date  . FINGER SURGERY Right         Home Medications    Prior to Admission medications   Medication Sig Start Date End Date Taking? Authorizing Provider  calcipotriene-betamethasone (TACLONEX) ointment Apply 1 application topically as directed. 09/21/16    [provider]  carvedilol (COREG) 3.125 MG tablet Take 1 tablet (3.125 mg total) by mouth 2 (two) times daily. 11/24/16 05/19/17  End, Cristal Deer, MD  hydrochlorothiazide (HYDRODIURIL) 25 MG tablet Take 25 mg by mouth daily. 04/02/18   [provider]  ketoconazole (NIZORAL) 2 % cream Apply 1 application topically as directed. 09/21/16   [provider]  levofloxacin (LEVAQUIN) 500 MG tablet Take 1 tablet (500 mg total) by mouth daily. 10/29/18   Bethann Berkshire, MD  nitroGLYCERIN (NITROSTAT) 0.4 MG SL tablet Place 1 tablet (0.4 mg total) under the tongue every 5 (five) minutes as needed for chest pain. Please make appt with Dr. Okey Dupre. 1st attempt 01/17/18   End, Cristal Deer, MD  pantoprazole (PROTONIX) 40 MG tablet Take 40 mg by mouth every morning. 04/02/18   [provider]    Family History Family History  Adopted: Yes  Problem Relation Age of Onset  . Heart disease Sister   . Cancer Sister     Social History Social History   Tobacco Use  . Smoking status: Former Smoker    Packs/day: 1.00    Years: 25.00  Pack years: 25.00    Types: Cigarettes    Last attempt to quit: 06/08/2013    Years since quitting: 5.3  . Smokeless tobacco: Never Used  Substance Use Topics  . Alcohol use: Yes    Alcohol/week: 36.0 standard drinks    Types: 36 Cans of beer per week  . Drug use: No     Allergies   Penicillins; Erythromycin; Clindamycin/lincomycin; and Doxycycline   Review of Systems Review of Systems  Constitutional: Negative for appetite change and fatigue.  HENT: Negative for congestion, ear discharge and sinus pressure.   Eyes: Negative for discharge.  Respiratory: Negative for cough.   Cardiovascular: Negative for chest pain.  Gastrointestinal: Negative for abdominal pain and diarrhea.  Genitourinary: Negative for frequency and hematuria.       Scrotal swelling  Musculoskeletal: Negative for back pain.  Skin: Negative for rash.   Neurological: Negative for seizures and headaches.  Psychiatric/Behavioral: Negative for hallucinations.     Physical Exam Updated Vital Signs BP (!) 166/94 (BP Location: Left Arm)   Pulse (!) 106   Temp 98 F (36.7 C) (Oral)   Resp 18   Ht 5' 9.5" (1.765 m)   Wt 132.5 kg   SpO2 97%   BMI 42.50 kg/m   Physical Exam Constitutional:      Appearance: He is well-developed.  HENT:     Head: Normocephalic.  Eyes:     General: No scleral icterus.    Conjunctiva/sclera: Conjunctivae normal.  Neck:     Musculoskeletal: Neck supple.     Thyroid: No thyromegaly.  Cardiovascular:     Rate and Rhythm: Normal rate and regular rhythm.     Heart sounds: No murmur. No friction rub. No gallop.   Pulmonary:     Breath sounds: No stridor. No wheezing or rales.  Chest:     Chest wall: No tenderness.  Abdominal:     General: There is no distension.     Tenderness: There is no abdominal tenderness. There is no rebound.  Genitourinary:    Comments: 1 cm draining area of small abscess scrotum Musculoskeletal: Normal range of motion.  Lymphadenopathy:     Cervical: No cervical adenopathy.  Skin:    Findings: No erythema or rash.  Neurological:     Mental Status: He is oriented to person, place, and time.     Motor: No abnormal muscle tone.     Coordination: Coordination normal.  Psychiatric:        Behavior: Behavior normal.      ED Treatments / Results  Labs (all labs ordered are listed, but only abnormal results are displayed) Labs Reviewed  CBC WITH DIFFERENTIAL/PLATELET  BASIC METABOLIC PANEL    EKG None  Radiology No results found.  Procedures Procedures (including critical care time)  Medications Ordered in ED Medications  HYDROmorphone (DILAUDID) injection 1 mg (1 mg Intravenous Not Given 10/29/18 2223)  ondansetron (ZOFRAN) injection 4 mg (4 mg Intravenous Not Given 10/29/18 2231)  levofloxacin (LEVAQUIN) IVPB 500 mg (500 mg Intravenous Not Given 10/29/18  2231)     Initial Impression / Assessment and Plan / ED Course  I have reviewed the triage vital signs and the nursing notes.  Pertinent labs & imaging results that were available during my care of the patient were reviewed by me and considered in my medical decision making (see chart for details).       With small abscess scrotum. Patient refused blood work IV antibiotics and I&D. He  just wanted an antibiotic to take. I explained that he still may need to get this drained and he was referred to urology  Final Clinical Impressions(s) / ED Diagnoses   Final diagnoses:  Abscess    ED Discharge Orders         Ordered    levofloxacin (LEVAQUIN) 500 MG tablet  Daily     10/29/18 2242           Bethann Berkshire, MD 10/29/18 2247

## 2018-10-29 NOTE — ED Triage Notes (Signed)
Pt. I have boil on my scrotum for 1 week, I had one on my leg alos that was treated with Bactrim, so Its probably not working.

## 2018-10-29 NOTE — ED Notes (Signed)
In to give pt ordered meds and obtain bloodwork. Pt states he wants the least amount done for him and a script for antibiotics. Communicated pt request to MD Zammit. Holding off on orders for now. Will await further instruction for MD

## 2019-01-23 ENCOUNTER — Other Ambulatory Visit: Payer: Self-pay | Admitting: Internal Medicine

## 2019-01-23 DIAGNOSIS — R079 Chest pain, unspecified: Secondary | ICD-10-CM

## 2019-01-23 DIAGNOSIS — R0602 Shortness of breath: Secondary | ICD-10-CM

## 2019-01-23 NOTE — Telephone Encounter (Signed)
Refill Request.  

## 2019-05-15 ENCOUNTER — Telehealth: Payer: Self-pay | Admitting: Cardiology

## 2019-05-15 NOTE — Telephone Encounter (Signed)
Called patient on cell number, but, VM was not set up so could not leave a message.  Called patient on home number and left a VM to call and schedule an appt with Dr. Harrell Gave.

## 2019-06-17 ENCOUNTER — Ambulatory Visit: Payer: Self-pay | Admitting: Cardiology

## 2019-11-06 ENCOUNTER — Emergency Department (HOSPITAL_COMMUNITY)
Admission: EM | Admit: 2019-11-06 | Discharge: 2019-11-07 | Disposition: A | Discharge status: Home / Self Care | Payer: BC Managed Care – PPO | Attending: Emergency Medicine | Admitting: Emergency Medicine

## 2019-11-06 DIAGNOSIS — Z5321 Procedure and treatment not carried out due to patient leaving prior to being seen by health care provider: Secondary | ICD-10-CM | POA: Diagnosis not present

## 2019-11-06 DIAGNOSIS — X501XXA Overexertion from prolonged static or awkward postures, initial encounter: Secondary | ICD-10-CM | POA: Insufficient documentation

## 2019-11-06 DIAGNOSIS — R0789 Other chest pain: Secondary | ICD-10-CM | POA: Diagnosis not present

## 2019-11-06 NOTE — ED Triage Notes (Signed)
Pt in POV. Reports L side pain/rib cage pain since yesterday. Reports he was bracing himself on a branch trying to get his kitten out of a tree when the branch snapped and he "twisted the wrong way" Pt diaphoretic in triage.

## 2019-11-07 ENCOUNTER — Emergency Department (HOSPITAL_COMMUNITY): Payer: BC Managed Care – PPO

## 2019-11-07 NOTE — ED Notes (Signed)
Pt states he will just wait for his results from Xray to hit his mychart, he is going home.

## 2019-11-07 NOTE — ED Notes (Signed)
Pt did not respond when called for vitals check 

## 2021-07-06 IMAGING — DX DG RIBS W/ CHEST 3+V*R*
6 series · 6 of 6 positions shown · non-contrast
Comparison: None.

CLINICAL DATA: Right ribcage pain

EXAM:
RIGHT RIBS AND CHEST - 3+ VIEW

[chest pa]
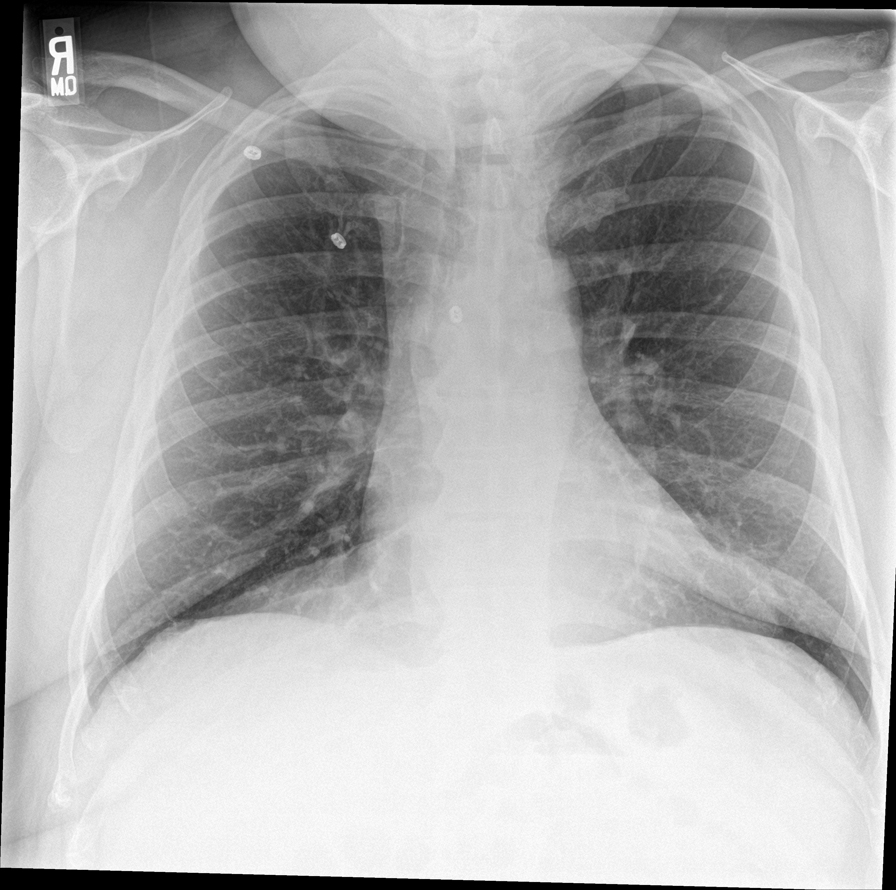

[rib pa]
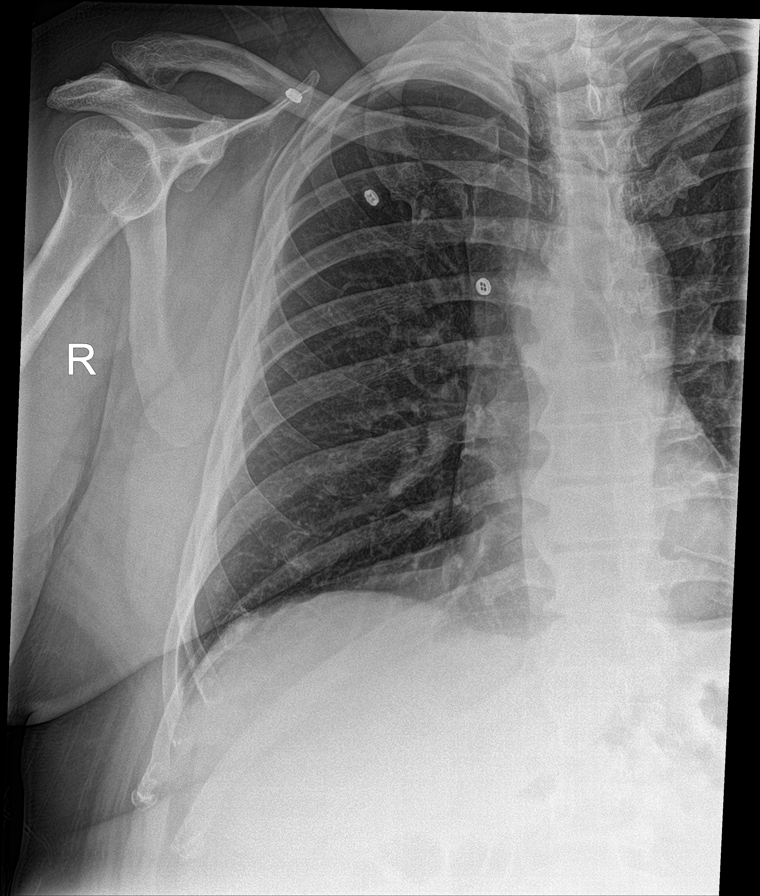

[rib pa obl (1 of 3)]
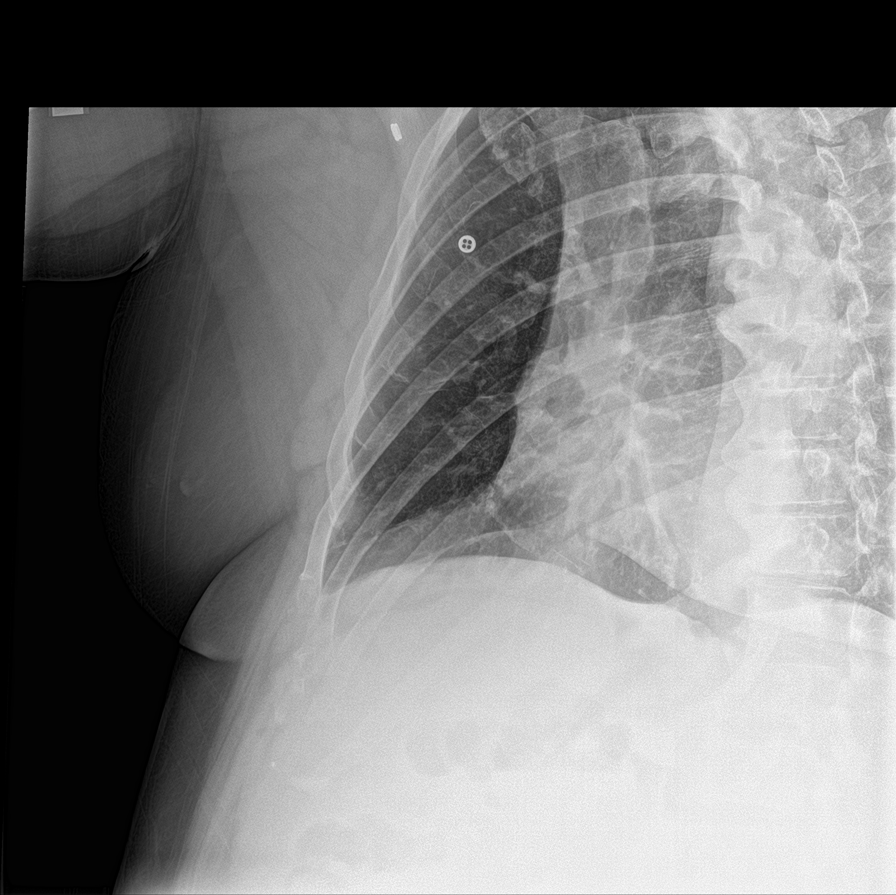

[rib pa obl (2 of 3)]
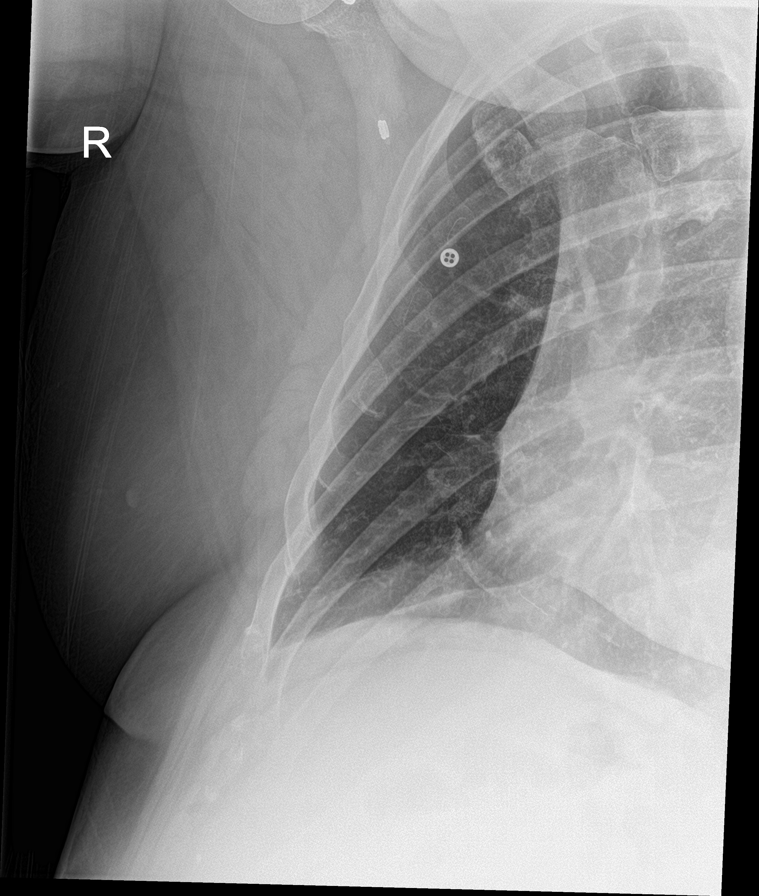

[rib ap]
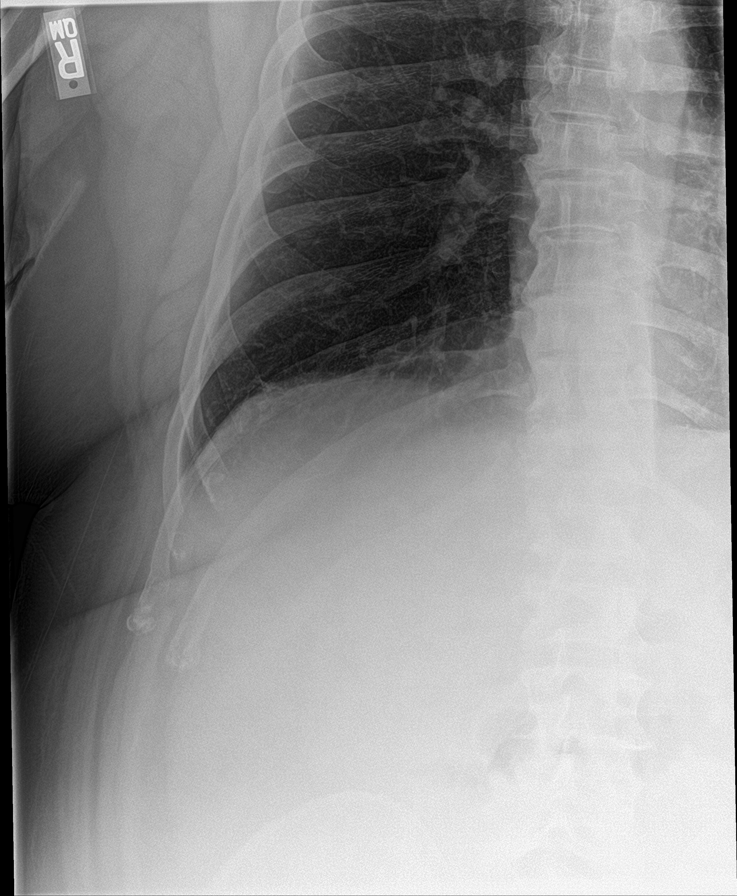

[rib pa obl (3 of 3)]
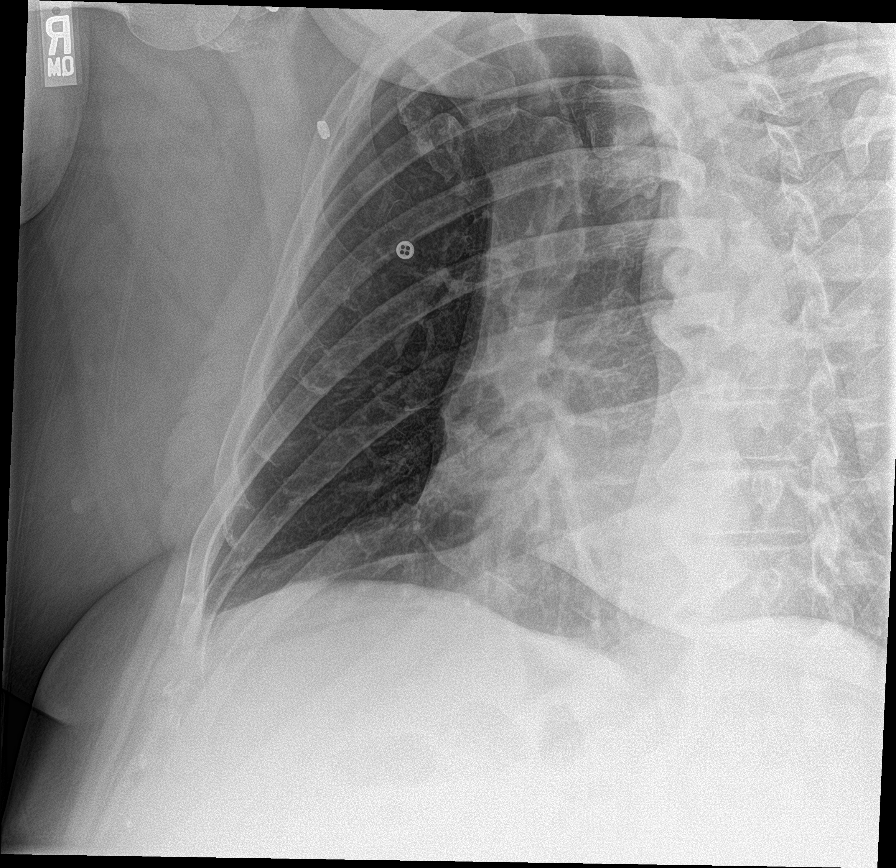

[6 of 6 positions shown; findings below may reference images not displayed]

FINDINGS: No fracture or other bone lesions are seen involving the ribs. There
is no evidence of pneumothorax or pleural effusion. Both lungs are
clear. Heart size and mediastinal contours are within normal limits.
IMPRESSION: Negative.

## 2022-05-24 ENCOUNTER — Encounter: Payer: Self-pay | Admitting: Adult Health Nurse Practitioner

## 2023-02-01 ENCOUNTER — Ambulatory Visit
Admission: EM | Admit: 2023-02-01 | Discharge: 2023-02-01 | Disposition: A | Discharge status: Home / Self Care | Payer: 59 | Attending: Family Medicine | Admitting: Family Medicine

## 2023-02-01 ENCOUNTER — Encounter: Payer: Self-pay | Admitting: Emergency Medicine

## 2023-02-01 ENCOUNTER — Other Ambulatory Visit: Payer: Self-pay

## 2023-02-01 DIAGNOSIS — L0292 Furuncle, unspecified: Secondary | ICD-10-CM | POA: Diagnosis not present

## 2023-02-01 HISTORY — DX: Type 2 diabetes mellitus without complications: E11.9

## 2023-02-01 MED ORDER — LEVOFLOXACIN 500 MG PO TABS: 500.0000 mg | ORAL_TABLET | Freq: Every day | ORAL | 0 refills | Status: AC

## 2023-02-01 MED ORDER — CHLORHEXIDINE GLUCONATE 4 % EX SOLN: Freq: Every day | CUTANEOUS | 0 refills | Status: AC | PRN

## 2023-02-01 NOTE — ED Triage Notes (Signed)
Pt reports generalized "boils" for last several months. Pt reports unable to see pcp until July. Pt reports hx of similar in the past and reports has been px abx.

## 2023-02-02 NOTE — ED Provider Notes (Signed)
[facility]   [id number] 02/01/23 Arrival Time: 1459  ASSESSMENT & PLAN:  1. Recurrent boils    None in need of I&D. Multiple antibiotic allergies. Reports Levaquin usu helps.  Meds ordered this encounter  Medications   levofloxacin (LEVAQUIN) 500 MG tablet    Sig: Take 1 tablet (500 mg total) by mouth daily.    Dispense:  10 tablet    Refill:  0   chlorhexidine (HIBICLENS) 4 % external liquid    Sig: Apply topically daily as needed. Use while bathing 3 times weekly.    Dispense:  473 mL    Refill:  0    Follow-up Information     Hemberg, Ruby Cola, NP.   Specialty: Adult Health Nurse Practitioner Why: As needed. Contact information: [address] [place] [address] [phone]                  Will follow up with PCP or here if worsening or failing to improve as anticipated. Reviewed expectations re: course of current medical issues. Questions answered. Outlined signs and symptoms indicating need for more acute intervention. Patient verbalized understanding. After Visit Summary given.   SUBJECTIVE:  Alexander Walls is a 52 y.o. male who presents with a skin complaint. Pt reports generalized "boils" for last several months. Pt reports unable to see pcp until July.  H/O simiilar; frequent.    OBJECTIVE: Vitals:   02/01/23 1628  BP: (!) 135/94  Pulse: 92  Resp: 20  Temp: 98.6 F (37 C)  TempSrc: Oral  SpO2: 95%    General appearance: alert; no distress HEENT: ; AT Neck: supple with FROM Lungs: clear to auscultation bilaterally Heart: regular rate and rhythm Extremities: no edema; moves all extremities normally Skin: warm and dry; multiple scattered erythematous indurations over body Psychological: alert and cooperative; normal mood and affect  Allergies  Allergen Reactions   Penicillins Shortness Of Breath, Swelling and Rash   Erythromycin Rash    Any of the mycin drugs   Clindamycin/Lincomycin Rash     ALL MYCIN MEDICATIONS CAUSE THIS REACTION   Doxycycline Rash    Past Medical History:  Diagnosis Date   Annular psoriasis    Anxiety    Arm pain    Blurry vision    Boil of scrotum    Boil of trunk    Chest pain    Chronic fatigue    Cough    Diabetes mellitus without complication (HCC)    Dyslipidemia    Edema    Effusion of right knee    GERD without esophagitis    Hypertension    Hypertension    Major depression    Major depression    MI, old 2005   MRSA infection    Obesity    Pain of scalp    Panic disorder    Plaque psoriasis    Prediabetes    Recurrent boils    Right knee pain    Shoulder pain, left    Skin infection    Thoracic aortic aneurysm (HCC) 12/2016   Viral gastroenteritis    Viral URI    Social History   Socioeconomic History   Marital status: Single    Spouse name: Not on file   Number of children: Not on file   Years of education: Not on file   Highest education level: Not on file  Occupational History   Not on file  Tobacco Use   Smoking status:  Former    Packs/day: 1.00    Years: 25.00    Additional pack years: 0.00    Total pack years: 25.00    Types: Cigarettes    Quit date: 06/08/2013    Years since quitting: 9.6   Smokeless tobacco: Never  Substance and Sexual Activity   Alcohol use: Yes    Alcohol/week: 36.0 standard drinks of alcohol    Types: 36 Cans of beer per week   Drug use: No   Sexual activity: Not on file  Other Topics Concern   Not on file  Social History Narrative   Not on file   Social Determinants of Health   Financial Resource Strain: Not on file  Food Insecurity: Not on file  Transportation Needs: Not on file  Physical Activity: Not on file  Stress: Not on file  Social Connections: Not on file  Intimate Partner Violence: Not on file   Family History  Adopted: Yes  Problem Relation Age of Onset   Heart disease Sister    Cancer Sister    Past Surgical History:  Procedure Laterality Date    FINGER SURGERY Right       Mardella Layman, MD 02/02/23 [phone]
